# Patient Record
Sex: Female | Born: 1955 | Race: White | Hispanic: No | Marital: Married | Smoking: Never smoker
Health system: Southern US, Community
[De-identification: ages and names within clinical notes are randomized; demographics above are authoritative.]

## PROBLEM LIST (undated history)

## (undated) DIAGNOSIS — I10 Essential (primary) hypertension: Secondary | ICD-10-CM

---

## 1998-08-22 ENCOUNTER — Other Ambulatory Visit: Admission: RE | Admit: 1998-08-22 | Discharge: 1998-08-22 | Payer: Self-pay | Admitting: Obstetrics & Gynecology

## 1999-08-16 ENCOUNTER — Encounter: Payer: Self-pay | Admitting: Obstetrics and Gynecology

## 1999-08-16 ENCOUNTER — Encounter: Admission: RE | Admit: 1999-08-16 | Discharge: 1999-08-16 | Payer: Self-pay | Admitting: Obstetrics and Gynecology

## 1999-08-24 ENCOUNTER — Other Ambulatory Visit: Admission: RE | Admit: 1999-08-24 | Discharge: 1999-08-24 | Payer: Self-pay | Admitting: Obstetrics and Gynecology

## 2001-01-20 ENCOUNTER — Encounter: Payer: Self-pay | Admitting: Obstetrics and Gynecology

## 2001-01-20 ENCOUNTER — Encounter: Admission: RE | Admit: 2001-01-20 | Discharge: 2001-01-20 | Payer: Self-pay | Admitting: Obstetrics and Gynecology

## 2002-03-26 ENCOUNTER — Encounter: Admission: RE | Admit: 2002-03-26 | Discharge: 2002-03-26 | Payer: Self-pay | Admitting: Obstetrics and Gynecology

## 2002-03-26 ENCOUNTER — Encounter: Payer: Self-pay | Admitting: Obstetrics and Gynecology

## 2003-09-03 ENCOUNTER — Encounter: Admission: RE | Admit: 2003-09-03 | Discharge: 2003-09-03 | Payer: Self-pay | Admitting: Obstetrics and Gynecology

## 2004-11-01 ENCOUNTER — Encounter: Admission: RE | Admit: 2004-11-01 | Discharge: 2004-11-01 | Payer: Self-pay | Admitting: Obstetrics and Gynecology

## 2005-11-05 ENCOUNTER — Encounter: Admission: RE | Admit: 2005-11-05 | Discharge: 2005-11-05 | Payer: Self-pay | Admitting: Family Medicine

## 2006-11-07 ENCOUNTER — Encounter: Admission: RE | Admit: 2006-11-07 | Discharge: 2006-11-07 | Payer: Self-pay | Admitting: Family Medicine

## 2008-03-02 ENCOUNTER — Encounter: Admission: RE | Admit: 2008-03-02 | Discharge: 2008-03-02 | Payer: Self-pay | Admitting: Family Medicine

## 2009-03-28 ENCOUNTER — Encounter: Admission: RE | Admit: 2009-03-28 | Discharge: 2009-03-28 | Payer: Self-pay | Admitting: Family Medicine

## 2010-04-24 ENCOUNTER — Encounter: Admission: RE | Admit: 2010-04-24 | Discharge: 2010-04-24 | Payer: Self-pay | Admitting: Family Medicine

## 2011-05-15 ENCOUNTER — Other Ambulatory Visit: Payer: Self-pay | Admitting: Family Medicine

## 2011-05-15 DIAGNOSIS — Z1231 Encounter for screening mammogram for malignant neoplasm of breast: Secondary | ICD-10-CM

## 2011-05-29 ENCOUNTER — Ambulatory Visit
Admission: RE | Admit: 2011-05-29 | Discharge: 2011-05-29 | Disposition: A | Discharge status: Home / Self Care | Payer: 59 | Source: Ambulatory Visit | Attending: Family Medicine | Admitting: Family Medicine

## 2011-05-29 DIAGNOSIS — Z1231 Encounter for screening mammogram for malignant neoplasm of breast: Secondary | ICD-10-CM

## 2012-07-10 ENCOUNTER — Other Ambulatory Visit: Payer: Self-pay | Admitting: Family Medicine

## 2012-07-10 DIAGNOSIS — Z1231 Encounter for screening mammogram for malignant neoplasm of breast: Secondary | ICD-10-CM

## 2012-07-17 ENCOUNTER — Ambulatory Visit (INDEPENDENT_AMBULATORY_CARE_PROVIDER_SITE_OTHER): Payer: 59

## 2012-07-17 DIAGNOSIS — Z1231 Encounter for screening mammogram for malignant neoplasm of breast: Secondary | ICD-10-CM

## 2013-07-23 ENCOUNTER — Other Ambulatory Visit: Payer: Self-pay | Admitting: Family Medicine

## 2013-07-23 DIAGNOSIS — Z1231 Encounter for screening mammogram for malignant neoplasm of breast: Secondary | ICD-10-CM

## 2013-07-30 ENCOUNTER — Ambulatory Visit (INDEPENDENT_AMBULATORY_CARE_PROVIDER_SITE_OTHER): Payer: 59

## 2013-07-30 DIAGNOSIS — Z1231 Encounter for screening mammogram for malignant neoplasm of breast: Secondary | ICD-10-CM

## 2014-06-02 ENCOUNTER — Other Ambulatory Visit: Payer: Self-pay | Admitting: Family Medicine

## 2014-06-02 DIAGNOSIS — Z1231 Encounter for screening mammogram for malignant neoplasm of breast: Secondary | ICD-10-CM

## 2014-08-05 ENCOUNTER — Ambulatory Visit (INDEPENDENT_AMBULATORY_CARE_PROVIDER_SITE_OTHER): Payer: 59

## 2014-08-05 DIAGNOSIS — Z1231 Encounter for screening mammogram for malignant neoplasm of breast: Secondary | ICD-10-CM

## 2015-08-01 ENCOUNTER — Other Ambulatory Visit: Payer: Self-pay | Admitting: Family Medicine

## 2015-08-01 DIAGNOSIS — Z1231 Encounter for screening mammogram for malignant neoplasm of breast: Secondary | ICD-10-CM

## 2015-08-18 ENCOUNTER — Ambulatory Visit (INDEPENDENT_AMBULATORY_CARE_PROVIDER_SITE_OTHER): Payer: Commercial Managed Care - HMO

## 2015-08-18 DIAGNOSIS — Z1231 Encounter for screening mammogram for malignant neoplasm of breast: Secondary | ICD-10-CM | POA: Diagnosis not present

## 2016-08-07 ENCOUNTER — Other Ambulatory Visit: Payer: Self-pay | Admitting: Family Medicine

## 2016-08-07 DIAGNOSIS — Z1231 Encounter for screening mammogram for malignant neoplasm of breast: Secondary | ICD-10-CM

## 2016-08-21 ENCOUNTER — Ambulatory Visit (INDEPENDENT_AMBULATORY_CARE_PROVIDER_SITE_OTHER): Payer: 59

## 2016-08-21 DIAGNOSIS — Z1231 Encounter for screening mammogram for malignant neoplasm of breast: Secondary | ICD-10-CM | POA: Diagnosis not present

## 2017-02-12 ENCOUNTER — Encounter (HOSPITAL_COMMUNITY)
Admission: EM | Disposition: A | Discharge status: Home / Self Care | Payer: Self-pay | Source: Home / Self Care | Attending: Emergency Medicine

## 2017-02-12 ENCOUNTER — Encounter (HOSPITAL_COMMUNITY): Payer: Self-pay | Admitting: Emergency Medicine

## 2017-02-12 ENCOUNTER — Emergency Department (HOSPITAL_COMMUNITY): Payer: BLUE CROSS/BLUE SHIELD | Admitting: Anesthesiology

## 2017-02-12 ENCOUNTER — Observation Stay (HOSPITAL_COMMUNITY)
Admission: EM | Admit: 2017-02-12 | Discharge: 2017-02-13 | Disposition: A | Discharge status: Home / Self Care | Payer: BLUE CROSS/BLUE SHIELD | Attending: General Surgery | Admitting: General Surgery

## 2017-02-12 ENCOUNTER — Emergency Department (HOSPITAL_COMMUNITY): Payer: BLUE CROSS/BLUE SHIELD

## 2017-02-12 DIAGNOSIS — K219 Gastro-esophageal reflux disease without esophagitis: Secondary | ICD-10-CM | POA: Insufficient documentation

## 2017-02-12 DIAGNOSIS — Z881 Allergy status to other antibiotic agents status: Secondary | ICD-10-CM | POA: Diagnosis not present

## 2017-02-12 DIAGNOSIS — Z818 Family history of other mental and behavioral disorders: Secondary | ICD-10-CM | POA: Diagnosis not present

## 2017-02-12 DIAGNOSIS — E669 Obesity, unspecified: Secondary | ICD-10-CM | POA: Diagnosis not present

## 2017-02-12 DIAGNOSIS — Z6841 Body Mass Index (BMI) 40.0 and over, adult: Secondary | ICD-10-CM | POA: Insufficient documentation

## 2017-02-12 DIAGNOSIS — Z79899 Other long term (current) drug therapy: Secondary | ICD-10-CM | POA: Insufficient documentation

## 2017-02-12 DIAGNOSIS — Z809 Family history of malignant neoplasm, unspecified: Secondary | ICD-10-CM | POA: Diagnosis not present

## 2017-02-12 DIAGNOSIS — K8020 Calculus of gallbladder without cholecystitis without obstruction: Secondary | ICD-10-CM | POA: Diagnosis present

## 2017-02-12 DIAGNOSIS — Z7982 Long term (current) use of aspirin: Secondary | ICD-10-CM | POA: Insufficient documentation

## 2017-02-12 DIAGNOSIS — I10 Essential (primary) hypertension: Secondary | ICD-10-CM | POA: Insufficient documentation

## 2017-02-12 DIAGNOSIS — Z882 Allergy status to sulfonamides status: Secondary | ICD-10-CM | POA: Diagnosis not present

## 2017-02-12 DIAGNOSIS — Z833 Family history of diabetes mellitus: Secondary | ICD-10-CM | POA: Diagnosis not present

## 2017-02-12 DIAGNOSIS — K8000 Calculus of gallbladder with acute cholecystitis without obstruction: Principal | ICD-10-CM | POA: Diagnosis present

## 2017-02-12 HISTORY — DX: Essential (primary) hypertension: I10

## 2017-02-12 HISTORY — PX: CHOLECYSTECTOMY: SHX55

## 2017-02-12 LAB — URINALYSIS, ROUTINE W REFLEX MICROSCOPIC
Bacteria, UA: NONE SEEN
Bilirubin Urine: NEGATIVE
GLUCOSE, UA: NEGATIVE mg/dL
Hgb urine dipstick: NEGATIVE
KETONES UR: NEGATIVE mg/dL
Nitrite: NEGATIVE
PROTEIN: NEGATIVE mg/dL
Specific Gravity, Urine: 1.008 (ref 1.005–1.030)
pH: 7 (ref 5.0–8.0)

## 2017-02-12 LAB — COMPREHENSIVE METABOLIC PANEL
ALBUMIN: 4 g/dL (ref 3.5–5.0)
ALK PHOS: 81 U/L (ref 38–126)
ALT: 19 U/L (ref 14–54)
ANION GAP: 7 (ref 5–15)
AST: 22 U/L (ref 15–41)
BUN: 14 mg/dL (ref 6–20)
CHLORIDE: 103 mmol/L (ref 101–111)
CO2: 30 mmol/L (ref 22–32)
Calcium: 9.7 mg/dL (ref 8.9–10.3)
Creatinine, Ser: 0.89 mg/dL (ref 0.44–1.00)
GFR calc non Af Amer: >60 mL/min (ref >60)
Glucose, Bld: 137 mg/dL — ABNORMAL HIGH (ref 65–99)
Potassium: 3.6 mmol/L (ref 3.5–5.1)
Sodium: 140 mmol/L (ref 135–145)
Total Bilirubin: 0.4 mg/dL (ref 0.3–1.2)
Total Protein: 7.1 g/dL (ref 6.5–8.1)

## 2017-02-12 LAB — CBC
HCT: 42.5 % (ref 36.0–46.0)
HEMOGLOBIN: 14 g/dL (ref 12.0–15.0)
MCH: 28.1 pg (ref 26.0–34.0)
MCHC: 32.9 g/dL (ref 30.0–36.0)
MCV: 85.2 fL (ref 78.0–100.0)
Platelets: 243 10*3/uL (ref 150–400)
RBC: 4.99 MIL/uL (ref 3.87–5.11)
RDW: 13.8 % (ref 11.5–15.5)
WBC: 11.3 10*3/uL — ABNORMAL HIGH (ref 4.0–10.5)

## 2017-02-12 LAB — LIPASE, BLOOD: Lipase: 24 U/L (ref 11–51)

## 2017-02-12 SURGERY — LAPAROSCOPIC CHOLECYSTECTOMY WITH INTRAOPERATIVE CHOLANGIOGRAM
Anesthesia: General | Site: Abdomen

## 2017-02-12 MED ORDER — MULTIVITAMINS PO CAPS: 1.0000 | ORAL_CAPSULE | Freq: Every day | ORAL | Status: DC

## 2017-02-12 MED ORDER — PROPOFOL 10 MG/ML IV BOLUS
INTRAVENOUS | Status: DC | PRN
  Administered 2017-02-12: 150 mg via INTRAVENOUS

## 2017-02-12 MED ORDER — LISINOPRIL 20 MG PO TABS
20.0000 mg | ORAL_TABLET | Freq: Every day | ORAL | Status: DC
  Administered 2017-02-13: 20 mg via ORAL
  Filled 2017-02-12 (×2): qty 1

## 2017-02-12 MED ORDER — HYDROMORPHONE HCL 1 MG/ML IJ SOLN
INTRAMUSCULAR | Status: AC
  Filled 2017-02-12: qty 1

## 2017-02-12 MED ORDER — IOPAMIDOL (ISOVUE-300) INJECTION 61%
INTRAVENOUS | Status: AC
  Filled 2017-02-12: qty 50

## 2017-02-12 MED ORDER — 0.9 % SODIUM CHLORIDE (POUR BTL) OPTIME
TOPICAL | Status: DC | PRN
  Administered 2017-02-12: 1000 mL

## 2017-02-12 MED ORDER — MELATONIN 3 MG PO TABS: 3.0000 mg | ORAL_TABLET | Freq: Every day | ORAL | Status: DC

## 2017-02-12 MED ORDER — KETOROLAC TROMETHAMINE 30 MG/ML IJ SOLN: 30.0000 mg | Freq: Once | INTRAMUSCULAR | Status: DC | PRN

## 2017-02-12 MED ORDER — PROPOFOL 10 MG/ML IV BOLUS
INTRAVENOUS | Status: AC
  Filled 2017-02-12: qty 20

## 2017-02-12 MED ORDER — SODIUM CHLORIDE 0.9 % IV BOLUS (SEPSIS)
1000.0000 mL | Freq: Once | INTRAVENOUS | Status: AC
  Administered 2017-02-12: 1000 mL via INTRAVENOUS

## 2017-02-12 MED ORDER — FENTANYL CITRATE (PF) 100 MCG/2ML IJ SOLN
INTRAMUSCULAR | Status: DC | PRN
  Administered 2017-02-12: 50 ug via INTRAVENOUS
  Administered 2017-02-12: 100 ug via INTRAVENOUS
  Administered 2017-02-12 (×2): 50 ug via INTRAVENOUS

## 2017-02-12 MED ORDER — LACTATED RINGERS IR SOLN
Status: DC | PRN
  Administered 2017-02-12: 1000 mL

## 2017-02-12 MED ORDER — FENTANYL CITRATE (PF) 100 MCG/2ML IJ SOLN
25.0000 ug | Freq: Once | INTRAMUSCULAR | Status: AC
  Administered 2017-02-12: 25 ug via INTRAVENOUS
  Filled 2017-02-12: qty 2

## 2017-02-12 MED ORDER — ONDANSETRON 4 MG PO TBDP: 4.0000 mg | ORAL_TABLET | Freq: Four times a day (QID) | ORAL | Status: DC | PRN

## 2017-02-12 MED ORDER — ENOXAPARIN SODIUM 40 MG/0.4ML ~~LOC~~ SOLN
40.0000 mg | SUBCUTANEOUS | Status: DC
  Administered 2017-02-13: 40 mg via SUBCUTANEOUS
  Filled 2017-02-12: qty 0.4

## 2017-02-12 MED ORDER — ONDANSETRON HCL 4 MG/2ML IJ SOLN
INTRAMUSCULAR | Status: AC
  Filled 2017-02-12: qty 2

## 2017-02-12 MED ORDER — HYDRALAZINE HCL 20 MG/ML IJ SOLN: 10.0000 mg | INTRAMUSCULAR | Status: DC | PRN

## 2017-02-12 MED ORDER — ONDANSETRON HCL 4 MG/2ML IJ SOLN
INTRAMUSCULAR | Status: DC | PRN
  Administered 2017-02-12: 4 mg via INTRAVENOUS

## 2017-02-12 MED ORDER — LACTATED RINGERS IV SOLN
INTRAVENOUS | Status: DC | PRN
  Administered 2017-02-12 (×2): via INTRAVENOUS
  Administered 2017-02-12: 1000 mL via INTRAVENOUS

## 2017-02-12 MED ORDER — HYDROMORPHONE HCL 1 MG/ML IJ SOLN
0.2500 mg | INTRAMUSCULAR | Status: DC | PRN
  Administered 2017-02-12 (×2): 0.5 mg via INTRAVENOUS

## 2017-02-12 MED ORDER — CALCIUM CARBONATE ANTACID 500 MG PO CHEW: 500.0000 mg | CHEWABLE_TABLET | Freq: Two times a day (BID) | ORAL | Status: DC | PRN

## 2017-02-12 MED ORDER — FAMOTIDINE 20 MG PO TABS
40.0000 mg | ORAL_TABLET | Freq: Every day | ORAL | Status: DC
  Administered 2017-02-12: 22:00:00 40 mg via ORAL
  Filled 2017-02-12: qty 2

## 2017-02-12 MED ORDER — DEXAMETHASONE SODIUM PHOSPHATE 10 MG/ML IJ SOLN
INTRAMUSCULAR | Status: AC
  Filled 2017-02-12: qty 1

## 2017-02-12 MED ORDER — LORATADINE 10 MG PO TABS
10.0000 mg | ORAL_TABLET | Freq: Every day | ORAL | Status: DC
  Administered 2017-02-12: 22:00:00 10 mg via ORAL
  Filled 2017-02-12: qty 1

## 2017-02-12 MED ORDER — CEFTRIAXONE SODIUM 2 G IJ SOLR
2.0000 g | Freq: Once | INTRAMUSCULAR | Status: AC
  Administered 2017-02-12: 2 g via INTRAVENOUS
  Filled 2017-02-12: qty 2

## 2017-02-12 MED ORDER — BUPIVACAINE HCL (PF) 0.25 % IJ SOLN
INTRAMUSCULAR | Status: DC | PRN
  Administered 2017-02-12: 30 mL

## 2017-02-12 MED ORDER — MORPHINE SULFATE (PF) 4 MG/ML IV SOLN: 2.0000 mg | INTRAVENOUS | Status: DC | PRN

## 2017-02-12 MED ORDER — QUINAPRIL-HYDROCHLOROTHIAZIDE 20-25 MG PO TABS: 1.0000 | ORAL_TABLET | ORAL | Status: DC

## 2017-02-12 MED ORDER — SUGAMMADEX SODIUM 200 MG/2ML IV SOLN
INTRAVENOUS | Status: DC | PRN
  Administered 2017-02-12: 200 mg via INTRAVENOUS

## 2017-02-12 MED ORDER — SODIUM CHLORIDE 0.9 % IV BOLUS (SEPSIS)
500.0000 mL | Freq: Once | INTRAVENOUS | Status: AC
  Administered 2017-02-12: 500 mL via INTRAVENOUS

## 2017-02-12 MED ORDER — FENTANYL CITRATE (PF) 250 MCG/5ML IJ SOLN
INTRAMUSCULAR | Status: AC
  Filled 2017-02-12: qty 5

## 2017-02-12 MED ORDER — SODIUM CHLORIDE 0.9 % IV SOLN
INTRAVENOUS | Status: DC
  Administered 2017-02-12: 16:00:00 via INTRAVENOUS

## 2017-02-12 MED ORDER — ONDANSETRON HCL 4 MG/2ML IJ SOLN
4.0000 mg | Freq: Four times a day (QID) | INTRAMUSCULAR | Status: DC | PRN
  Administered 2017-02-12: 16:00:00 4 mg via INTRAVENOUS
  Filled 2017-02-12: qty 2

## 2017-02-12 MED ORDER — DEXAMETHASONE SODIUM PHOSPHATE 10 MG/ML IJ SOLN
INTRAMUSCULAR | Status: DC | PRN
  Administered 2017-02-12: 10 mg via INTRAVENOUS

## 2017-02-12 MED ORDER — SIMETHICONE 80 MG PO CHEW: 40.0000 mg | CHEWABLE_TABLET | Freq: Four times a day (QID) | ORAL | Status: DC | PRN

## 2017-02-12 MED ORDER — ONDANSETRON HCL 4 MG/2ML IJ SOLN
4.0000 mg | Freq: Once | INTRAMUSCULAR | Status: AC
  Administered 2017-02-12: 4 mg via INTRAVENOUS
  Filled 2017-02-12: qty 2

## 2017-02-12 MED ORDER — DIPHENHYDRAMINE HCL 25 MG PO CAPS: 25.0000 mg | ORAL_CAPSULE | Freq: Four times a day (QID) | ORAL | Status: DC | PRN

## 2017-02-12 MED ORDER — SUCCINYLCHOLINE CHLORIDE 200 MG/10ML IV SOSY
PREFILLED_SYRINGE | INTRAVENOUS | Status: DC | PRN
  Administered 2017-02-12: 100 mg via INTRAVENOUS

## 2017-02-12 MED ORDER — DIPHENHYDRAMINE HCL 50 MG/ML IJ SOLN: 25.0000 mg | Freq: Four times a day (QID) | INTRAMUSCULAR | Status: DC | PRN

## 2017-02-12 MED ORDER — PROMETHAZINE HCL 25 MG/ML IJ SOLN: 6.2500 mg | INTRAMUSCULAR | Status: DC | PRN

## 2017-02-12 MED ORDER — HYDROCHLOROTHIAZIDE 25 MG PO TABS
25.0000 mg | ORAL_TABLET | Freq: Every day | ORAL | Status: DC
  Administered 2017-02-13: 09:00:00 25 mg via ORAL
  Filled 2017-02-12 (×2): qty 1

## 2017-02-12 MED ORDER — ADULT MULTIVITAMIN W/MINERALS CH
1.0000 | ORAL_TABLET | Freq: Every day | ORAL | Status: DC
  Administered 2017-02-12: 22:00:00 1 via ORAL
  Filled 2017-02-12: qty 1

## 2017-02-12 MED ORDER — LIDOCAINE 2% (20 MG/ML) 5 ML SYRINGE
INTRAMUSCULAR | Status: DC | PRN
Start: 2017-02-12 — End: 2017-02-12
  Administered 2017-02-12: 100 mg via INTRAVENOUS

## 2017-02-12 MED ORDER — BUPIVACAINE HCL (PF) 0.25 % IJ SOLN
INTRAMUSCULAR | Status: AC
  Filled 2017-02-12: qty 30

## 2017-02-12 MED ORDER — IBUPROFEN 400 MG PO TABS: 600.0000 mg | ORAL_TABLET | Freq: Four times a day (QID) | ORAL | Status: DC | PRN

## 2017-02-12 MED ORDER — SUGAMMADEX SODIUM 200 MG/2ML IV SOLN
INTRAVENOUS | Status: AC
  Filled 2017-02-12: qty 2

## 2017-02-12 MED ORDER — ROCURONIUM BROMIDE 10 MG/ML (PF) SYRINGE
PREFILLED_SYRINGE | INTRAVENOUS | Status: DC | PRN
  Administered 2017-02-12: 10 mg via INTRAVENOUS
  Administered 2017-02-12: 40 mg via INTRAVENOUS

## 2017-02-12 MED ORDER — FENTANYL CITRATE (PF) 100 MCG/2ML IJ SOLN
50.0000 ug | Freq: Once | INTRAMUSCULAR | Status: AC
  Administered 2017-02-12: 50 ug via INTRAVENOUS
  Filled 2017-02-12: qty 2

## 2017-02-12 MED ORDER — HYDROCODONE-ACETAMINOPHEN 5-325 MG PO TABS
1.0000 | ORAL_TABLET | ORAL | Status: DC | PRN
  Administered 2017-02-12 – 2017-02-13 (×2): 1 via ORAL
  Filled 2017-02-12 (×2): qty 1

## 2017-02-12 SURGICAL SUPPLY — 44 items
APPLIER CLIP ROT 10 11.4 M/L (STAPLE)
BANDAGE ADH SHEER 1  50/CT (GAUZE/BANDAGES/DRESSINGS) ×3 IMPLANT
BENZOIN TINCTURE PRP APPL 2/3 (GAUZE/BANDAGES/DRESSINGS) ×3 IMPLANT
CABLE HIGH FREQUENCY MONO STRZ (ELECTRODE) ×3 IMPLANT
CATH CHOLANG 76X19 KUMAR (CATHETERS) ×3 IMPLANT
CHLORAPREP W/TINT 26ML (MISCELLANEOUS) ×3 IMPLANT
CLIP APPLIE ROT 10 11.4 M/L (STAPLE) IMPLANT
CLIP LIGATING HEM O LOK PURPLE (MISCELLANEOUS) IMPLANT
CLIP LIGATING HEMO LOK XL GOLD (MISCELLANEOUS) IMPLANT
CLOSURE WOUND 1/2 X4 (GAUZE/BANDAGES/DRESSINGS) ×1
COVER MAYO STAND STRL (DRAPES) ×3 IMPLANT
COVER SURGICAL LIGHT HANDLE (MISCELLANEOUS) ×3 IMPLANT
DECANTER SPIKE VIAL GLASS SM (MISCELLANEOUS) ×3 IMPLANT
DERMABOND ADVANCED (GAUZE/BANDAGES/DRESSINGS) ×2
DERMABOND ADVANCED .7 DNX12 (GAUZE/BANDAGES/DRESSINGS) ×1 IMPLANT
DRAIN CHANNEL 19F RND (DRAIN) IMPLANT
DRAPE C-ARM 42X120 X-RAY (DRAPES) ×3 IMPLANT
EVACUATOR SILICONE 100CC (DRAIN) IMPLANT
GLOVE BIOGEL PI IND STRL 7.0 (GLOVE) ×1 IMPLANT
GLOVE BIOGEL PI INDICATOR 7.0 (GLOVE) ×2
GLOVE SURG SS PI 7.0 STRL IVOR (GLOVE) ×3 IMPLANT
GOWN STRL REUS W/TWL LRG LVL3 (GOWN DISPOSABLE) ×3 IMPLANT
GOWN STRL REUS W/TWL XL LVL3 (GOWN DISPOSABLE) ×6 IMPLANT
GRASPER SUT TROCAR 14GX15 (MISCELLANEOUS) ×3 IMPLANT
IRRIG SUCT STRYKERFLOW 2 WTIP (MISCELLANEOUS) ×3
IRRIGATION SUCT STRKRFLW 2 WTP (MISCELLANEOUS) ×1 IMPLANT
KIT BASIN OR (CUSTOM PROCEDURE TRAY) ×3 IMPLANT
POUCH RETRIEVAL ECOSAC 10 (ENDOMECHANICALS) ×1 IMPLANT
POUCH RETRIEVAL ECOSAC 10MM (ENDOMECHANICALS) ×2
SCISSORS LAP 5X35 DISP (ENDOMECHANICALS) ×3 IMPLANT
SHEARS HARMONIC ACE PLUS 36CM (ENDOMECHANICALS) IMPLANT
SLEEVE XCEL OPT CAN 5 100 (ENDOMECHANICALS) ×6 IMPLANT
STOPCOCK 4 WAY LG BORE MALE ST (IV SETS) ×3 IMPLANT
STRIP CLOSURE SKIN 1/2X4 (GAUZE/BANDAGES/DRESSINGS) ×2 IMPLANT
SUT ETHILON 2 0 PS N (SUTURE) IMPLANT
SUT MNCRL AB 4-0 PS2 18 (SUTURE) ×3 IMPLANT
SUT VICRYL 0 ENDOLOOP (SUTURE) IMPLANT
TOWEL OR 17X26 10 PK STRL BLUE (TOWEL DISPOSABLE) ×3 IMPLANT
TOWEL OR NON WOVEN STRL DISP B (DISPOSABLE) IMPLANT
TRAY LAPAROSCOPIC (CUSTOM PROCEDURE TRAY) ×3 IMPLANT
TROCAR BLADELESS OPT 5 100 (ENDOMECHANICALS) ×3 IMPLANT
TROCAR OPTICAL STANDARD 5MM (TROCAR) ×3 IMPLANT
TROCAR XCEL 12X100 BLDLESS (ENDOMECHANICALS) ×3 IMPLANT
TUBING INSUF HEATED (TUBING) ×3 IMPLANT

## 2017-02-12 NOTE — Anesthesia Preprocedure Evaluation (Signed)
Anesthesia Evaluation  Patient identified by MRN, date of birth, ID band Patient awake    Reviewed: Allergy & Precautions, NPO status , Patient's Chart, lab work & pertinent test results  Airway Mallampati: II  TM Distance: >3 FB Neck ROM: Full    Dental no notable dental hx. (+)    Pulmonary neg pulmonary ROS,    Pulmonary exam normal breath sounds clear to auscultation       Cardiovascular hypertension, Normal cardiovascular exam Rhythm:Regular Rate:Normal     Neuro/Psych negative neurological ROS  negative psych ROS   GI/Hepatic negative GI ROS, Neg liver ROS,   Endo/Other  negative endocrine ROS  Renal/GU negative Renal ROS  negative genitourinary   Musculoskeletal negative musculoskeletal ROS (+)   Abdominal   Peds negative pediatric ROS (+)  Hematology negative hematology ROS (+)   Anesthesia Other Findings   Reproductive/Obstetrics negative OB ROS                            Anesthesia Physical Anesthesia Plan  ASA: II  Anesthesia Plan: General   Post-op Pain Management:    Induction: Intravenous  Airway Management Planned: Oral ETT  Additional Equipment:   Intra-op Plan:   Post-operative Plan: Extubation in OR  Informed Consent: I have reviewed the patients History and Physical, chart, labs and discussed the procedure including the risks, benefits and alternatives for the proposed anesthesia with the patient or authorized representative who has indicated his/her understanding and acceptance.   Dental advisory given  Plan Discussed with: CRNA  Anesthesia Plan Comments:         Anesthesia Quick Evaluation  

## 2017-02-12 NOTE — ED Triage Notes (Signed)
Pt states she has upper abd pain that started today after she ate supper, BBQ.  Pt states the pain radiates around her right side and into her back  Has had one episode of vomiting prior to arrival

## 2017-02-12 NOTE — H&P (Signed)
[facility] Consult/Admission Note  Jerome Otter [birth year]  [id number].    Requesting MD: Dr. Tomi Bamberger  Chief Complaint/Reason for Consult: abdominal pain    HPI:  Pt is a 61 year old obese female with a history of HTN and GERD who presented to the ED with complaints of abdominal pain for more than a month. Pt states she would have epigastric pain with fatty foods roughly 1.5-2hrs after eating. She began having worsening pain 2 days ago after eating ice cream for dinner. She had pain most of the night but it improved. Pain after dinner (BBQ and cole slaw) roughly 2 hours yesterday was in the epigastric region radiating into her back, severe, constant, nothing made it better, she tried tums without relief. She had a normal BM after the onset of pain. One episode of nonbloody emesis that she states contained her lunch and dinner. No other associated symptoms. She has not eaten since last night. She takes a ASA '81mg'$  a day. Scant NSAID use. Take famotidine for her GERD. She denies fever, chills, SOB, CP.  ROS:  Review of Systems  Constitutional: Negative for chills and fever.  Respiratory: Negative for shortness of breath.   Cardiovascular: Negative for chest pain.  Gastrointestinal: Positive for abdominal pain, nausea and vomiting. Negative for blood in stool, constipation and diarrhea.  Genitourinary: Negative for dysuria.  Skin: Negative for rash.  Neurological: Negative for dizziness, loss of consciousness and weakness.  All other systems reviewed and are negative.    Family History  Problem Relation Age of Onset  . Mental illness Mother   . Cancer Father   . Cancer Brother   . Diabetes Other     Past Medical History:  Diagnosis Date  . Hypertension     History reviewed. No pertinent surgical history.  Social History:  reports that she has never smoked. She has never used smokeless tobacco. She reports that she does not drink alcohol or use drugs.  Allergies:   Allergies  Allergen Reactions  . Macrodantin [Nitrofurantoin Macrocrystal] Rash  . Sulfa Antibiotics Rash     (Not in a hospital admission)  Blood pressure (!) 142/68, pulse 70, temperature 97.8 F (36.6 C), temperature source Oral, resp. rate 16, height 5' (1.524 m), weight 210 lb (95.3 kg), SpO2 96 %.  Physical Exam  Constitutional: She is oriented to person, place, and time and well-developed, well-nourished, and in no distress. Vital signs are normal. No distress.  Well appearing, obese white female, pleasant  HENT:  Head: Normocephalic and atraumatic.  Nose: Nose normal.  Mouth/Throat: Oropharynx is clear and moist. No oropharyngeal exudate.  Eyes: Conjunctivae and EOM are normal. Pupils are equal, round, and reactive to light. Right eye exhibits no discharge. Left eye exhibits no discharge. No scleral icterus.  Neck: Normal range of motion. Neck supple. No tracheal deviation present. No thyromegaly present.  Cardiovascular: Normal rate, regular rhythm, normal heart sounds and intact distal pulses.  Exam reveals no gallop and no friction rub.   No murmur heard. Pulses:      Radial pulses are 2+ on the right side, and 2+ on the left side.       Dorsalis pedis pulses are 2+ on the right side, and 2+ on the left side.  Pulmonary/Chest: Effort normal and breath sounds normal. No respiratory distress. She has no decreased breath sounds. She has no wheezes. She has no rhonchi. She has no rales.  Abdominal: Soft. Normal appearance and bowel sounds are normal. She  exhibits no distension and no mass. There is no hepatosplenomegaly. There is tenderness in the right upper quadrant and epigastric area. There is no rebound, no guarding, no tenderness at McBurney's point and negative Murphy's sign.  Musculoskeletal: Normal range of motion. She exhibits no edema or deformity.  Lymphadenopathy:    She has no cervical adenopathy.  Neurological: She is alert and oriented to person, place, and  time.  Skin: Skin is warm and dry. No rash noted. She is not diaphoretic.  Psychiatric: Mood and affect normal.  Nursing note and vitals reviewed.   Results for orders placed or performed during the hospital encounter of 02/12/17 (from the past 48 hour(s))  Lipase, blood     Status: None   Collection Time: 02/12/17 12:50 AM  Result Value Ref Range   Lipase 24 11 - 51 U/L  Comprehensive metabolic panel     Status: Abnormal   Collection Time: 02/12/17 12:50 AM  Result Value Ref Range   Sodium 140 135 - 145 mmol/L   Potassium 3.6 3.5 - 5.1 mmol/L   Chloride 103 101 - 111 mmol/L   CO2 30 22 - 32 mmol/L   Glucose, Bld 137 (H) 65 - 99 mg/dL   BUN 14 6 - 20 mg/dL   Creatinine, Ser 0.89 0.44 - 1.00 mg/dL   Calcium 9.7 8.9 - 10.3 mg/dL   Total Protein 7.1 6.5 - 8.1 g/dL   Albumin 4.0 3.5 - 5.0 g/dL   AST 22 15 - 41 U/L   ALT 19 14 - 54 U/L   Alkaline Phosphatase 81 38 - 126 U/L   Total Bilirubin 0.4 0.3 - 1.2 mg/dL   GFR calc non Af Amer >60 >60 mL/min   GFR calc Af Amer >60 >60 mL/min    Comment: (NOTE) The eGFR has been calculated using the CKD EPI equation. This calculation has not been validated in all clinical situations. eGFR's persistently <60 mL/min signify possible Chronic Kidney Disease.    Anion gap 7 5 - 15  CBC     Status: Abnormal   Collection Time: 02/12/17 12:50 AM  Result Value Ref Range   WBC 11.3 (H) 4.0 - 10.5 K/uL   RBC 4.99 3.87 - 5.11 MIL/uL   Hemoglobin 14.0 12.0 - 15.0 g/dL   HCT 42.5 36.0 - 46.0 %   MCV 85.2 78.0 - 100.0 fL   MCH 28.1 26.0 - 34.0 pg   MCHC 32.9 30.0 - 36.0 g/dL   RDW 13.8 11.5 - 15.5 %   Platelets 243 150 - 400 K/uL  Urinalysis, Routine w reflex microscopic     Status: Abnormal   Collection Time: 02/12/17  4:06 AM  Result Value Ref Range   Color, Urine YELLOW YELLOW   APPearance CLEAR CLEAR   Specific Gravity, Urine 1.008 1.005 - 1.030   pH 7.0 5.0 - 8.0   Glucose, UA NEGATIVE NEGATIVE mg/dL   Hgb urine dipstick NEGATIVE  NEGATIVE   Bilirubin Urine NEGATIVE NEGATIVE   Ketones, ur NEGATIVE NEGATIVE mg/dL   Protein, ur NEGATIVE NEGATIVE mg/dL   Nitrite NEGATIVE NEGATIVE   Leukocytes, UA LARGE (A) NEGATIVE   RBC / HPF 0-5 0 - 5 RBC/hpf   WBC, UA TOO NUMEROUS TO COUNT 0 - 5 WBC/hpf   Bacteria, UA NONE SEEN NONE SEEN   Squamous Epithelial / LPF 0-5 (A) NONE SEEN   Mucous PRESENT    US Abdomen Limited Ruq  Result Date: 02/12/2017 CLINICAL DATA:  Food intolerance for  several months, increased last night. Acute right upper quadrant pain. Vomiting. Hypertension. EXAM: US ABDOMEN LIMITED - RIGHT UPPER QUADRANT COMPARISON:  None. FINDINGS: Gallbladder: Mobile stones are seen in the gallbladder, largest measuring 1.6 cm diameter. No gallbladder wall thickening, edema, or sludge. Murphy's sign is negative. Common bile duct: Diameter: 4.2 mm, normal Liver: No focal lesion identified. Within normal limits in parenchymal echogenicity. IMPRESSION: Cholelithiasis without additional evidence of cholecystitis. Electronically Signed   By: Lucienne Capers M.D.   On: 02/12/2017 06:44      Assessment/Plan Symptomatic Cholelithiasis  - no concerns for cholecystitis on imaging - WBC 11.3 - pt would like [facility], likely today or tomorrow - will admit to [facility] service  Will discuss timing with MD. Thank you for the consult.   Kalman Drape, [facility] 02/12/2017, 9:25 AM Pager: [phone] Consults: [phone] Mon-Fri 7:00 am-4:30 pm Sat-Sun 7:00 am-11:30 am

## 2017-02-12 NOTE — ED Notes (Signed)
SURGERY PRESENT. PT WILL STAY IN ED AND TRANSFER TO SURGERY. EXPECTED SURGERY TIME 12 NOON.  CONSENT SIGNED. CHARGE STACEY W RN, PT AND FAMILY MADE AWARE OF PLAN OF CARE.

## 2017-02-12 NOTE — ED Notes (Signed)
ED Provider at bedside. 

## 2017-02-12 NOTE — Transfer of Care (Signed)
Immediate Anesthesia Transfer of Care Note  Patient: Jennifer Reese  Procedure(s) Performed: Procedure(s): LAPAROSCOPIC CHOLECYSTECTOMY (N/A)  Patient Location: PACU  Anesthesia Type:General  Level of Consciousness:  sedated, patient cooperative and responds to stimulation  Airway & Oxygen Therapy:Patient Spontanous Breathing and Patient connected to face mask oxgen  Post-op Assessment:  Report given to PACU RN and Post -op Vital signs reviewed and stable  Post vital signs:  Reviewed and stable  Last Vitals:  Vitals:   02/12/17 1124 02/12/17 1332  BP: 121/67 (!) 148/94  Pulse: 74 89  Resp: 18 17  Temp: 36.6 C (P) 36.9 C    Complications: No apparent anesthesia complications

## 2017-02-12 NOTE — Op Note (Signed)
PATIENT:  Jennifer Reese  61 y.o. female  PRE-OPERATIVE DIAGNOSIS:  Cholecystitis  POST-OPERATIVE DIAGNOSIS:  Cholecystitis  PROCEDURE:  Procedure(s): LAPAROSCOPIC CHOLECYSTECTOMY   SURGEON:  Surgeon(s): Omarr Hann, De BlanchLuke Aaron, MD  ASSISTANT: Mattie MarlinJessica Focht, P.A.  ANESTHESIA:   local and general  Indications for procedure: Jennifer Reese is a 61 y.o. female with symptoms of Abdominal pain and Nausea and vomiting consistent with gallbladder disease, Confirmed by Ultrasound.  Description of procedure: The patient was brought into the operative suite, placed supine. Anesthesia was administered with endotracheal tube. Patient was strapped in place and foot board was secured. All pressure points were offloaded by foam padding. The patient was prepped and draped in the usual sterile fashion.  A small incision was made to the right of the umbilicus. A 5mm trocar was inserted into the peritoneal cavity with optical entry. Pneumoperitoneum was applied with high flow low pressure. 2 5mm trocars were placed in the RUQ. A 12mm trocar was placed in the subxiphoid space. All trocars sites were first anesthesized with 0.25% marcaine with epinephrine in the subcutaneous and preperitoneal layers. Next the patient was placed in reverse trendelenberg. The gallbladder was red and inflamed.  The gallbladder was retracted cephalad and lateral. The peritoneum was reflected off the infundibulum working lateral to medial. The cystic duct and cystic artery were identified and further dissection revealed a critical view. The cystic duct and cystic artery were doubly clipped and ligated.   The gallbladder was removed off the liver bed with cautery. The Gallbladder was placed in a specimen bag. The gallbladder fossa was irrigated and hemostasis was applied with cautery. The gallbladder was removed via the 12mm trocar. The fascial defect was closed with interrupted 0 vicryl suture via laparoscopic trans-fascial suture passer.  Pneumoperitoneum was removed, all trocar were removed. All incisions were closed with 4-0 monocryl subcuticular stitch. The patient woke from anesthesia and was brought to PACU in stable condition. All counts were correct  Findings: inflamed gallbladder  Specimen: gallbladder  Blood loss: Total I/O In: 1150 [I.V.:1100; IV Piggyback:50] Out: -  ml  Local anesthesia: 30ml 0.25% marcaine  Complications: none  PLAN OF CARE: Admit for overnight observation  PATIENT DISPOSITION:  PACU - hemodynamically stable.  Feliciana RossettiLuke Almarie Kurdziel, M.D. General, Bariatric, & Minimally Invasive Surgery [facility]

## 2017-02-12 NOTE — Anesthesia Procedure Notes (Signed)
Procedure Name: Intubation Date/Time: 02/12/2017 12:35 PM Performed by: Tula Schryver, Virgel Gess Pre-anesthesia Checklist: Patient identified, Emergency Drugs available, Suction available, Patient being monitored and Timeout performed Patient Re-evaluated:Patient Re-evaluated prior to inductionOxygen Delivery Method: Circle system utilized Preoxygenation: Pre-oxygenation with 100% oxygen Intubation Type: IV induction Ventilation: Mask ventilation without difficulty Laryngoscope Size: Mac and 4 Grade View: Grade III Tube type: Oral Tube size: 7.5 mm Number of attempts: 1 Airway Equipment and Method: Bougie stylet Placement Confirmation: ETT inserted through vocal cords under direct vision,  positive ETCO2,  CO2 detector and breath sounds checked- equal and bilateral Secured at: 21 cm Tube secured with: Tape Dental Injury: Teeth and Oropharynx as per pre-operative assessment

## 2017-02-12 NOTE — ED Notes (Signed)
Ultrasound at bedside

## 2017-02-12 NOTE — ED Provider Notes (Signed)
[facility]-EMERGENCY DEPT Provider Note   CSN: [id number] Arrival date & time: 02/12/17  0005  Time seen 05:21 AM   History   Chief Complaint Chief Complaint  Patient presents with  . Abdominal Pain    HPI Jennifer Reese is a 61 y.o. female.  HPI   patient states for the past few months she has learned to avoid eating fried foods because it will cause nausea. She states a week ago she ate lettuce and spaghetti and had nausea all day. She states on May 6 she ate butter pecan Ice Cream and Had Discomfort All Night Long. She States She Had Burning in Her Abdomen and She Took Tums and the Pain Got Better.Tonight she ate BBQ and slaw and in less than an hour she started having abdominal bloating and upper abdominal pain that radiates into her back. She also states she has some soreness in her right lower quadrant. She states she had nausea and vomited once around 11 PM. She denies diarrhea and states she had a normal bowel movement. She denies any fever. She states the Tums did not make her pain go away tonight. She states she still having significant pain. She states her sister and one of her sons has had their gallbladder removed.  PCP Richmond Campbell., PA-C   Past Medical History:  Diagnosis Date  . Hypertension     There are no active problems to display for this patient.   History reviewed. No pertinent surgical history.  OB History    No data available       Home Medications    Prior to Admission medications   Medication Sig Start Date End Date Taking? Authorizing Provider  aspirin EC 81 MG tablet Take 81 mg by mouth every morning.   Yes [provider]  calcium carbonate (TUMS) 500 MG chewable tablet Chew 500 mg by mouth 2 (two) times daily as needed for indigestion or heartburn.   Yes [provider]  famotidine (PEPCID) 40 MG tablet Take 40 mg by mouth at bedtime. 01/10/17  Yes [provider]  loratadine (CLARITIN) 10 MG tablet Take 10 mg by mouth  at bedtime.   Yes [provider]  Melatonin 3 MG TABS Take 3 mg by mouth at bedtime.   Yes [provider]  Multiple Vitamin (MULTIVITAMIN) capsule Take 1 capsule by mouth at bedtime.   Yes [provider]  quinapril-hydrochlorothiazide (ACCURETIC) 20-25 MG tablet Take 1 tablet by mouth every morning. 10/23/16  Yes [provider]  vitamin E 400 UNIT capsule Take 400 Units by mouth at bedtime.   Yes [provider]    Family History Family History  Problem Relation Age of Onset  . Mental illness Mother   . Cancer Father   . Cancer Brother   . Diabetes Other     Social History Social History  Substance Use Topics  . Smoking status: Never Smoker  . Smokeless tobacco: Never Used  . Alcohol use No  lives at home Lives with spouse   Allergies   Macrodantin [nitrofurantoin macrocrystal] and Sulfa antibiotics   Review of Systems Review of Systems  All other systems reviewed and are negative.    Physical Exam Updated Vital Signs BP 136/76 (BP Location: Left Arm)   Pulse 77   Temp 97.8 F (36.6 C) (Oral)   Resp 19   Ht 5' (1.524 m)   Wt 210 lb (95.3 kg)   SpO2 98%   BMI 41.01 kg/m  Vital signs normal    Physical Exam  Constitutional: She is oriented to person, place, and time. She appears well-developed and well-nourished.  Non-toxic appearance. She does not appear ill. No distress.  HENT:  Head: Normocephalic and atraumatic.  Right Ear: External ear normal.  Left Ear: External ear normal.  Nose: Nose normal. No mucosal edema or rhinorrhea.  Mouth/Throat: Oropharynx is clear and moist and mucous membranes are normal. No dental abscesses or uvula swelling.  Eyes: Conjunctivae and EOM are normal. Pupils are equal, round, and reactive to light.  Neck: Normal range of motion and full passive range of motion without pain. Neck supple.  Cardiovascular: Normal rate, regular rhythm and normal heart sounds.  Exam reveals no  gallop and no friction rub.   No murmur heard. Pulmonary/Chest: Effort normal and breath sounds normal. No respiratory distress. She has no wheezes. She has no rhonchi. She has no rales. She exhibits no tenderness and no crepitus.  Abdominal: Soft. Normal appearance and bowel sounds are normal. She exhibits no distension. There is no tenderness. There is no rebound and no guarding.    Patient is tender across her upper abdomen but is most tender in the right upper quadrant. She also has some tenderness in the right lower quadrant. She does not have pain in her abdomen on knee tapping were have a positive psoas sign. She states changing positions or movement does not make that pain worse.  Musculoskeletal: Normal range of motion. She exhibits no edema or tenderness.  Moves all extremities well.   Neurological: She is alert and oriented to person, place, and time. She has normal strength. No cranial nerve deficit.  Skin: Skin is warm, dry and intact. No rash noted. No erythema. No pallor.  Psychiatric: She has a normal mood and affect. Her speech is normal and behavior is normal. Her mood appears not anxious.  Nursing note and vitals reviewed.    ED Treatments / Results  Labs (all labs ordered are listed, but only abnormal results are displayed) Results for orders placed or performed during the hospital encounter of 02/12/17  Lipase, blood  Result Value Ref Range   Lipase 24 11 - 51 U/L  Comprehensive metabolic panel  Result Value Ref Range   Sodium 140 135 - 145 mmol/L   Potassium 3.6 3.5 - 5.1 mmol/L   Chloride 103 101 - 111 mmol/L   CO2 30 22 - 32 mmol/L   Glucose, Bld 137 (H) 65 - 99 mg/dL   BUN 14 6 - 20 mg/dL   Creatinine, Ser 4.09 0.44 - 1.00 mg/dL   Calcium 9.7 8.9 - 81.1 mg/dL   Total Protein 7.1 6.5 - 8.1 g/dL   Albumin 4.0 3.5 - 5.0 g/dL   AST 22 15 - 41 U/L   ALT 19 14 - 54 U/L   Alkaline Phosphatase 81 38 - 126 U/L   Total Bilirubin 0.4 0.3 - 1.2 mg/dL   GFR calc non  Af Amer >60 >60 mL/min   GFR calc Af Amer >60 >60 mL/min   Anion gap 7 5 - 15  CBC  Result Value Ref Range   WBC 11.3 (H) 4.0 - 10.5 K/uL   RBC 4.99 3.87 - 5.11 MIL/uL   Hemoglobin 14.0 12.0 - 15.0 g/dL   HCT 91.4 78.2 - 95.6 %   MCV 85.2 78.0 - 100.0 fL   MCH 28.1 26.0 - 34.0 pg   MCHC 32.9 30.0 - 36.0 g/dL   RDW 21.3 08.6 -  15.5 %   Platelets 243 150 - 400 K/uL  Urinalysis, Routine w reflex microscopic  Result Value Ref Range   Color, Urine YELLOW YELLOW   APPearance CLEAR CLEAR   Specific Gravity, Urine 1.008 1.005 - 1.030   pH 7.0 5.0 - 8.0   Glucose, UA NEGATIVE NEGATIVE mg/dL   Hgb urine dipstick NEGATIVE NEGATIVE   Bilirubin Urine NEGATIVE NEGATIVE   Ketones, ur NEGATIVE NEGATIVE mg/dL   Protein, ur NEGATIVE NEGATIVE mg/dL   Nitrite NEGATIVE NEGATIVE   Leukocytes, UA LARGE (A) NEGATIVE   RBC / HPF 0-5 0 - 5 RBC/hpf   WBC, UA TOO NUMEROUS TO COUNT 0 - 5 WBC/hpf   Bacteria, UA NONE SEEN NONE SEEN   Squamous Epithelial / LPF 0-5 (A) NONE SEEN   Mucous PRESENT    Laboratory interpretation all normal except Leukocytosis, significant white blood cells in her urine without positive nitrates or bacteria. Patient states that was similar to a recent UA at her doctor's office.  EKG  EKG Interpretation None       Radiology [place] Abdomen Limited Ruq  Result Date: 02/12/2017 CLINICAL DATA:  Food intolerance for several months, increased last night. Acute right upper quadrant pain. Vomiting. Hypertension. EXAM: US ABDOMEN LIMITED - RIGHT UPPER QUADRANT COMPARISON:  None. FINDINGS: Gallbladder: Mobile stones are seen in the gallbladder, largest measuring 1.6 cm diameter. No gallbladder wall thickening, edema, or sludge. Murphy's sign is negative. Common bile duct: Diameter: 4.2 mm, normal Liver: No focal lesion identified. Within normal limits in parenchymal echogenicity. IMPRESSION: Cholelithiasis without additional evidence of cholecystitis. Electronically Signed   By: Burman NievesWilliam   Stevens M.D.   On: 02/12/2017 06:44    Procedures Procedures (including critical care time)  Medications Ordered in ED Medications  cefTRIAXone (ROCEPHIN) 2 g in dextrose 5 % 50 mL IVPB (not administered)  fentaNYL (SUBLIMAZE) injection 25 mcg (not administered)  sodium chloride 0.9 % bolus 1,000 mL (1,000 mLs Intravenous New Bag/Given 02/12/17 0538)  sodium chloride 0.9 % bolus 500 mL (0 mLs Intravenous Stopped 02/12/17 0711)  ondansetron (ZOFRAN) injection 4 mg (4 mg Intravenous Given 02/12/17 0538)  fentaNYL (SUBLIMAZE) injection 50 mcg (50 mcg Intravenous Given 02/12/17 0539)     Initial Impression / Assessment and Plan / ED Course  I have reviewed the triage vital signs and the nursing notes.  Pertinent labs & imaging results that were available during my care of the patient were reviewed by me and considered in my medical decision making (see chart for details).  Patient was given IV fluids and IV pain and nausea medication. We are going to start with ultrasound of her right upper quadrant to look for gallstones.  7:35 AM patient states her pain is improved but not gone. We discussed her ultrasound result. Patient was given the option of going home with medication or having surgery see her today for possible cholecystectomy. She prefers to be evaluated today. Patient was started on antibiotics. She was given more pain medication.  8:21 AM I spoke to the surgery PA, Shanda BumpsJessica, who will come see patient.   Final Clinical Impressions(s) / ED Diagnoses   Final diagnoses:  Calculus of gallbladder without cholecystitis without obstruction    Disposition pending surgery evaluation  Devoria AlbeIva Salil Raineri, MD, Concha PyoFACEP     Aireona Torelli, MD 02/12/17 [phone]

## 2017-02-12 NOTE — Progress Notes (Signed)
PHARMACIST - PHYSICIAN ORDER COMMUNICATION  CONCERNING: P&T Medication Policy on Herbal Medications  DESCRIPTION:  This patient's order for:  Melatonin  has been noted.  This product(s) is classified as an "herbal" or natural product. Due to a lack of definitive safety studies or FDA approval, nonstandard manufacturing practices, plus the potential risk of unknown drug-drug interactions while on inpatient medications, the Pharmacy and Therapeutics Committee does not permit the use of "herbal" or natural products of this type within [facility].   ACTION TAKEN: The pharmacy department is unable to verify this order at this time and your patient has been informed of this safety policy. Please reevaluate patient's clinical condition at discharge and address if the herbal or natural product(s) should be resumed at that time.  Loralee PacasErin Rasheda Ledger, PharmD, BCPS 02/12/2017 2:59 PM Pharmacy [phone]

## 2017-02-12 NOTE — Anesthesia Postprocedure Evaluation (Signed)
Anesthesia Post Note  Patient: Jennifer Reese  Procedure(s) Performed: Procedure(s) (LRB): LAPAROSCOPIC CHOLECYSTECTOMY (N/A)  Patient location during evaluation: PACU Anesthesia Type: General Level of consciousness: awake and alert Pain management: pain level controlled Vital Signs Assessment: post-procedure vital signs reviewed and stable Respiratory status: spontaneous breathing, nonlabored ventilation, respiratory function stable and patient connected to nasal cannula oxygen Cardiovascular status: blood pressure returned to baseline and stable Postop Assessment: no signs of nausea or vomiting Anesthetic complications: no       Last Vitals:  Vitals:   02/12/17 1415 02/12/17 1433  BP: (!) 141/73 (!) 156/70  Pulse: 72 79  Resp: 19 16  Temp:  36.5 C    Last Pain:  Vitals:   02/12/17 1415  TempSrc:   PainSc: 2                  [name]

## 2017-02-13 ENCOUNTER — Encounter (HOSPITAL_COMMUNITY): Payer: Self-pay | Admitting: General Surgery

## 2017-02-13 MED ORDER — HYDROCODONE-ACETAMINOPHEN 5-325 MG PO TABS: 1.0000 | ORAL_TABLET | ORAL | 0 refills | Status: AC | PRN

## 2017-02-13 NOTE — Discharge Instructions (Signed)
Please arrive at least 30 min before your appointment to complete your check in paperwork.  If you are unable to arrive 30 min prior to your appointment time we may have to cancel or reschedule you.  LAPAROSCOPIC SURGERY: POST OP INSTRUCTIONS  1. DIET: Follow a light bland diet the first 24 hours after arrival home, such as soup, liquids, crackers, etc. Be sure to include lots of fluids daily. Avoid fast food or heavy meals as your are more likely to get nauseated. Eat a low fat the next few days after surgery.  2. Take your usually prescribed home medications unless otherwise directed. 3. PAIN CONTROL:  1. Pain is best controlled by a usual combination of three different methods TOGETHER:  1. Ice/Heat 2. Over the counter pain medication 3. Prescription pain medication 2. Most patients will experience some swelling and bruising around the incisions. Ice packs or heating pads (30-60 minutes up to 6 times a day) will help. Use ice for the first few days to help decrease swelling and bruising, then switch to heat to help relax tight/sore spots and speed recovery. Some people prefer to use ice alone, heat alone, alternating between ice & heat. Experiment to what works for you. Swelling and bruising can take several weeks to resolve.  3. It is helpful to take an over-the-counter pain medication regularly for the first few weeks. Choose one of the following that works best for you:  1. Naproxen (Aleve, etc) Two 220mg  tabs twice a day 2. Ibuprofen (Advil, etc) Three 200mg  tabs four times a day (every meal & bedtime) 3. Acetaminophen (Tylenol, etc) 500-650mg  four times a day (every meal & bedtime) 4. A prescription for pain medication (such as oxycodone, hydrocodone, etc) should be given to you upon discharge. Take your pain medication as prescribed.  1. If you are having problems/concerns with the prescription medicine (does not control pain, nausea, vomiting, rash, itching, etc), please call us [phone]  [phone] to see if we need to switch you to a different pain medicine that will work better for you and/or control your side effect better. 2. If you need a refill on your pain medication, please contact your pharmacy. They will contact our office to request authorization. Prescriptions will not be filled after 5 pm or on week-ends. 4. Avoid getting constipated. Between the surgery and the pain medications, it is common to experience some constipation. Increasing fluid intake and taking a fiber supplement (such as Metamucil, Citrucel, FiberCon, MiraLax, etc) 1-2 times a day regularly will usually help prevent this problem from occurring. A mild laxative (prune juice, Milk of Magnesia, MiraLax, etc) should be taken according to package directions if there are no bowel movements after 48 hours.  5. Watch out for diarrhea. If you have many loose bowel movements, simplify your diet to bland foods & liquids for a few days. Stop any stool softeners and decrease your fiber supplement. Switching to mild anti-diarrheal medications (Kayopectate, Pepto Bismol) can help. If this worsens or does not improve, please call us. 4. Wash / shower every day. You may shower tomorrow (2 days after surgery) and remove the Band-Aids. Leave the steri-strips.. Continue to shower over incision(s) after the dressing is off. You do not need to re-cover your incisions. 6. ACTIVITIES as tolerated:  1. You may resume regular (light) daily activities beginning the next day--such as daily self-care, walking, climbing stairs--gradually increasing activities as tolerated. If you can walk 30 minutes without difficulty, it is safe to try more intense  activity such as jogging, treadmill, bicycling, low-impact aerobics, swimming, etc. 2. Save the most intensive and strenuous activity for last such as sit-ups, heavy lifting, contact sports, etc Refrain from any heavy lifting or straining until you are off narcotics for pain control.  3. DO NOT PUSH  THROUGH PAIN. Let pain be your guide: If it hurts to do something, don't do it. Pain is your body warning you to avoid that activity for another week until the pain goes down. 4. You may drive when you are no longer taking prescription pain medication, you can comfortably wear a seatbelt, and you can safely maneuver your car and apply brakes. 5. You may have sexual intercourse when it is comfortable.  7. FOLLOW UP in our office  1. Please call [facility] at [phone] to set up an appointment to see your surgeon in the office for a follow-up appointment approximately 2-3 weeks after your surgery. 2. Make sure that you call for this appointment the day you arrive home to insure a convenient appointment time.      10. IF YOU HAVE DISABILITY OR FAMILY LEAVE FORMS, BRING THEM TO THE               OFFICE FOR PROCESSING.   WHEN TO CALL us [phone]:  1. Poor pain control 2. Reactions / problems with new medications (rash/itching, nausea, etc)  3. Fever over 101.5 F (38.5 C) 4. Inability to urinate 5. Nausea and/or vomiting 6. Worsening swelling or bruising 7. Continued bleeding from incision. 8. Increased pain, redness, or drainage from the incision  The clinic staff is available to answer your questions during regular business hours (8:30am-5pm). Please dont hesitate to call and ask to speak to one of our nurses for clinical concerns.  If you have a medical emergency, go to the nearest emergency room or call 911.  A surgeon from [facility] is always on call at the [facility] [place]  [address], [place], [place] [address] ?  MAIN: [phone] ? TOLL FREE: [phone] ?  FAX [phone]  [email or web address]

## 2017-02-13 NOTE — Discharge Summary (Signed)
[facility] Discharge Summary   Patient ID: Jennifer HomeBonnie Knauer MRN: [id number] DOB/AGE: 62-Jan-1957 62 y.o.  Admit date: 02/12/2017 Discharge date: 02/13/2017  Admitting Diagnosis: cholecystitis  Discharge Diagnosis Patient Active Problem List   Diagnosis Date Noted  . Acute calculous cholecystitis 02/12/2017    Consultants None  Imaging: Kore[place] Abdomen Limited Ruq  Result Date: 02/12/2017 CLINICAL DATA:  Food intolerance for several months, increased last night. Acute right upper quadrant pain. Vomiting. Hypertension. EXAM: US ABDOMEN LIMITED - RIGHT UPPER QUADRANT COMPARISON:  None. FINDINGS: Gallbladder: Mobile stones are seen in the gallbladder, largest measuring 1.6 cm diameter. No gallbladder wall thickening, edema, or sludge. Murphy's sign is negative. Common bile duct: Diameter: 4.2 mm, normal Liver: No focal lesion identified. Within normal limits in parenchymal echogenicity. IMPRESSION: Cholelithiasis without additional evidence of cholecystitis. Electronically Signed   By: Burman NievesWilliam  Stevens M.D.   On: 02/12/2017 06:44    Procedures Dr. Sheliah HatchKinsinger (02/12/17) - Laparoscopic Cholecystectomy    Hospital Course:  Jennifer Reese is a 61 year old female who [facility].  Workup showed cholecystitis.  Patient was admitted and underwent procedure listed above.  Tolerated procedure well and was transferred to the floor.  Diet was advanced as tolerated.  On POD#1, the patient was voiding well, tolerating diet, ambulating well, pain well controlled, vital signs stable, incisions c/d/i and felt stable for discharge home.  Patient will follow up in our office in 2 weeks and knows to call with questions or concerns.  She will call to confirm appointment date/time.    Patient was discharged in good condition.  The [place] Substance controlled database was reviewed prior to prescribing narcotic pain medication to this patient.  Physical Exam: General:   Alert, NAD, pleasant, cooperative, well appearing Cardio: RRR, S1 & S2 normal, no murmur, rubs, gallops Resp: Effort normal, lungs CTA bilaterally, no wheezes, rales, rhonchi Abd:  Soft, ND, + BS, incisions C/D/I, mild generalized TTP, no guarding Skin: warm and dry, no rashes noted   Allergies as of 02/13/2017      Reactions   Macrodantin [nitrofurantoin Macrocrystal] Rash   Sulfa Antibiotics Rash      Medication List    TAKE these medications   aspirin EC 81 MG tablet Take 81 mg by mouth every morning.   famotidine 40 MG tablet Commonly known as:  PEPCID Take 40 mg by mouth at bedtime.   HYDROcodone-acetaminophen 5-325 MG tablet Commonly known as:  NORCO/VICODIN Take 1-2 tablets by mouth every 4 (four) hours as needed for moderate pain.   loratadine 10 MG tablet Commonly known as:  CLARITIN Take 10 mg by mouth at bedtime.   Melatonin 3 MG Tabs Take 3 mg by mouth at bedtime.   multivitamin capsule Take 1 capsule by mouth at bedtime.   quinapril-hydrochlorothiazide 20-25 MG tablet Commonly known as:  ACCURETIC Take 1 tablet by mouth every morning.   TUMS 500 MG chewable tablet Generic drug:  calcium carbonate Chew 500 mg by mouth 2 (two) times daily as needed for indigestion or heartburn.   vitamin E 400 UNIT capsule Take 400 Units by mouth at bedtime.        Follow-up Information    [facility] [place] Call.   Specialty:  General Surgery Why:  to confirm your follow up appointment date and time Contact information: [address] [place] [phone]          Signed: Jerre SimonJessica L Travonne Schowalter  [facility] 02/13/2017, 8:14 AM Pager: [phone] Consults: [phone] Mon-Fri 7:00 am-4:30 pm Sat-Sun 7:00 am-11:30 am

## 2017-02-13 NOTE — Progress Notes (Signed)
Discharge planning, no HH needs identified. [phone] 

## 2017-03-11 NOTE — Addendum Note (Signed)
Addendum  created 03/11/17 1428 by Denney Shein, MD   Sign clinical note    

## 2017-03-11 NOTE — Anesthesia Postprocedure Evaluation (Signed)
Anesthesia Post Note  Patient: Jennifer Reese  Procedure(s) Performed: Procedure(s) (LRB): LAPAROSCOPIC CHOLECYSTECTOMY (N/A)     Anesthesia Post Evaluation  Last Vitals:  Vitals:   02/13/17 0005 02/13/17 0450  BP: 135/61 (!) 148/63  Pulse: 84 93  Resp: 16 15  Temp: 36.8 C 36.8 C    Last Pain:  Vitals:   02/13/17 1031  TempSrc:   PainSc: 5                  Johannes Everage S

## 2017-08-14 ENCOUNTER — Other Ambulatory Visit: Payer: Self-pay | Admitting: Family Medicine

## 2017-08-14 DIAGNOSIS — Z1231 Encounter for screening mammogram for malignant neoplasm of breast: Secondary | ICD-10-CM

## 2017-08-28 ENCOUNTER — Other Ambulatory Visit: Payer: Self-pay | Admitting: Family Medicine

## 2017-08-28 ENCOUNTER — Ambulatory Visit (INDEPENDENT_AMBULATORY_CARE_PROVIDER_SITE_OTHER): Payer: BLUE CROSS/BLUE SHIELD

## 2017-08-28 DIAGNOSIS — Z1231 Encounter for screening mammogram for malignant neoplasm of breast: Secondary | ICD-10-CM | POA: Diagnosis not present

## 2017-11-01 ENCOUNTER — Encounter (HOSPITAL_COMMUNITY): Payer: Self-pay | Admitting: General Surgery

## 2018-08-07 ENCOUNTER — Other Ambulatory Visit: Payer: Self-pay | Admitting: Family Medicine

## 2018-08-07 DIAGNOSIS — Z1231 Encounter for screening mammogram for malignant neoplasm of breast: Secondary | ICD-10-CM

## 2018-09-03 ENCOUNTER — Ambulatory Visit (INDEPENDENT_AMBULATORY_CARE_PROVIDER_SITE_OTHER): Payer: BLUE CROSS/BLUE SHIELD

## 2018-09-03 DIAGNOSIS — Z1231 Encounter for screening mammogram for malignant neoplasm of breast: Secondary | ICD-10-CM | POA: Diagnosis not present

## 2019-10-05 ENCOUNTER — Other Ambulatory Visit: Payer: Self-pay | Admitting: Family Medicine

## 2019-10-05 DIAGNOSIS — Z1231 Encounter for screening mammogram for malignant neoplasm of breast: Secondary | ICD-10-CM

## 2019-10-07 ENCOUNTER — Ambulatory Visit (INDEPENDENT_AMBULATORY_CARE_PROVIDER_SITE_OTHER): Payer: BC Managed Care – PPO

## 2019-10-07 ENCOUNTER — Other Ambulatory Visit: Payer: Self-pay

## 2019-10-07 DIAGNOSIS — Z1231 Encounter for screening mammogram for malignant neoplasm of breast: Secondary | ICD-10-CM

## 2019-10-08 ENCOUNTER — Ambulatory Visit: Payer: BLUE CROSS/BLUE SHIELD

## 2020-09-16 ENCOUNTER — Other Ambulatory Visit: Payer: Self-pay | Admitting: Family Medicine

## 2020-09-16 DIAGNOSIS — Z1231 Encounter for screening mammogram for malignant neoplasm of breast: Secondary | ICD-10-CM

## 2020-10-12 ENCOUNTER — Other Ambulatory Visit: Payer: Self-pay

## 2020-10-12 ENCOUNTER — Ambulatory Visit (INDEPENDENT_AMBULATORY_CARE_PROVIDER_SITE_OTHER): Payer: Medicare Other

## 2020-10-12 DIAGNOSIS — Z1231 Encounter for screening mammogram for malignant neoplasm of breast: Secondary | ICD-10-CM | POA: Diagnosis not present

## 2021-07-10 ENCOUNTER — Encounter: Payer: Self-pay | Admitting: Orthopaedic Surgery

## 2021-07-10 ENCOUNTER — Ambulatory Visit: Payer: Self-pay

## 2021-07-10 ENCOUNTER — Ambulatory Visit: Payer: Medicare Other | Admitting: Orthopaedic Surgery

## 2021-07-10 VITALS — Ht 61.0 in | Wt 219.0 lb

## 2021-07-10 DIAGNOSIS — M25551 Pain in right hip: Secondary | ICD-10-CM

## 2021-07-10 DIAGNOSIS — M5441 Lumbago with sciatica, right side: Secondary | ICD-10-CM

## 2021-07-10 MED ORDER — TIZANIDINE HCL 4 MG PO TABS: 4.0000 mg | ORAL_TABLET | Freq: Two times a day (BID) | ORAL | 1 refills | Status: DC | PRN

## 2021-07-10 MED ORDER — MELOXICAM 15 MG PO TABS: 15.0000 mg | ORAL_TABLET | Freq: Every day | ORAL | 2 refills | Status: AC | PRN

## 2021-07-10 NOTE — Progress Notes (Signed)
Office Visit Note   Patient: Jennifer Reese           Date of Birth: 03-13-1956           MRN: [id number] Visit Date: 07/10/2021              Requested by: Richmond Campbell., PA-C [address],  [place] [address] PCP: Richmond Campbell., PA-C   Assessment & Plan: Visit Diagnoses:  1. Pain in right hip   2. Acute right-sided low back pain with right-sided sciatica     Plan: Her signs and symptoms are consistent with low back pain to the right side and sciatica.  I will continue her meloxicam and Zanaflex and will send her to outpatient physical therapy for McKenzie exercises and other modalities that can decrease her sciatica.  We will see her back in 4 weeks to see how she is doing overall.  My next step will be considering a MRI of the lumbar spine if her sciatic symptoms continue.  Follow-Up Instructions: Return in about 4 weeks (around 08/07/2021).   Orders:  Orders Placed This Encounter  Procedures   XR HIP UNILAT W OR W/O PELVIS 1V RIGHT   Meds ordered this encounter  Medications   tiZANidine (ZANAFLEX) 4 MG tablet    Sig: Take 1 tablet (4 mg total) by mouth 2 (two) times daily as needed for muscle spasms.    Dispense:  60 tablet    Refill:  1   meloxicam (MOBIC) 15 MG tablet    Sig: Take 1 tablet (15 mg total) by mouth daily as needed for pain.    Dispense:  30 tablet    Refill:  2      Procedures: No procedures performed   Clinical Data: No additional findings.   Subjective: Chief Complaint  Patient presents with   Right Hip - Pain  The patient is a 65 year old female who comes in with right-sided low back pain and sciatica.  She denies any groin pain.  This is been going on since May.  Is began after walking [place] sand [place] the beach.  Steroid taper really helped things calm down but then her sciatic symptoms came back.  She is not a diabetic.  Her BMI is 41.38.  She has tried tenacity and and meloxicam and needs a refill of both of these.  She  has not had any formal physical therapy.  She denies any change in bowel bladder function or weakness in her legs or numbness and tingling in her feet.  HPI  Review of Systems She currently denies a headache, chest pain, shortness of breath, fever, chills, nausea, vomiting  Objective: Vital Signs: Ht 5\' 1"  (1.549 m)   Wt 219 lb (99.3 kg)   BMI 41.38 kg/m   Physical Exam She is alert and orient x3 and in no acute distress Ortho Exam Examination of her right hip is entirely normal.  She has full and fluid range of motion of the right hip with no pain in the groin and no blocks or rotation.  There is no pain of the trochanteric area of her right hip.  Her pain is in the sciatic region and there is low back pain to the right side and pain on sciatic stretch. Specialty Comments:  No specialty comments available.  Imaging: XR HIP UNILAT W OR W/O PELVIS 1V RIGHT  Result Date: 07/10/2021 An AP pelvis and lateral right hip shows normal-appearing hip bilaterally.  PMFS History: Patient Active Problem List   Diagnosis Date Noted   Acute calculous cholecystitis 02/12/2017   Past Medical History:  Diagnosis Date   Hypertension     Family History  Problem Relation Age of Onset   Mental illness Mother    Cancer Father    Cancer Brother    Diabetes Other     Past Surgical History:  Procedure Laterality Date   CHOLECYSTECTOMY N/A 02/12/2017   Procedure: LAPAROSCOPIC CHOLECYSTECTOMY;  Surgeon: Kinsinger, De Blanch, MD;  Location: [facility];  Service: General;  Laterality: N/A;   Social History   Occupational History   Not on file  Tobacco Use   Smoking status: Never   Smokeless tobacco: Never  Substance and Sexual Activity   Alcohol use: No   Drug use: No   Sexual activity: Not on file

## 2021-07-13 ENCOUNTER — Telehealth: Payer: Self-pay | Admitting: Orthopaedic Surgery

## 2021-07-13 ENCOUNTER — Other Ambulatory Visit: Payer: Self-pay

## 2021-07-13 DIAGNOSIS — M5441 Lumbago with sciatica, right side: Secondary | ICD-10-CM

## 2021-07-13 NOTE — Telephone Encounter (Signed)
The order was missed. Referral now put in. Can you please call her to schedule. Thank you

## 2021-07-13 NOTE — Telephone Encounter (Signed)
Pt calling checking on the status of referral she was getting. Pt states Dr. Magnus Ivan was going to send her to an outpatient rehab before following up with [place] after. She wanted to make sure she did not miss anything or if there was anything on her side she needed to do to push that along. The best call back number is [phone].

## 2021-07-26 ENCOUNTER — Ambulatory Visit: Payer: Medicare Other | Admitting: Rehabilitative and Restorative Service Providers"

## 2021-08-02 ENCOUNTER — Other Ambulatory Visit: Payer: Self-pay | Admitting: Orthopaedic Surgery

## 2021-08-07 ENCOUNTER — Encounter: Payer: Self-pay | Admitting: Orthopaedic Surgery

## 2021-08-07 ENCOUNTER — Other Ambulatory Visit: Payer: Self-pay

## 2021-08-07 ENCOUNTER — Ambulatory Visit: Payer: Medicare Other | Admitting: Orthopaedic Surgery

## 2021-08-07 DIAGNOSIS — M5441 Lumbago with sciatica, right side: Secondary | ICD-10-CM | POA: Diagnosis not present

## 2021-08-07 NOTE — Progress Notes (Signed)
The patient is being seen in follow-up for right-sided sciatica.  She has not been able to go to therapy yet but does started coming up.  She also is retiring from her work on 10 November.  She is 65 years old.  I saw her 4 weeks ago for the sciatic symptoms.  I put her on a muscle relaxant medication to take at night.  She said that is helping and things have not gotten worse for her.  Today she denies any weakness in her legs or numbness and tingling.  She has negative straight leg raise on the right side and excellent strength in her bilateral lower extremities.  I will hold off on MRI since her symptoms are not worsened and wait to see how she does with physical therapy.  She agrees with this treatment plan.  We will see her back in 6 weeks to see how she is done with physical therapy due to her low back pain and sciatica.

## 2021-08-11 ENCOUNTER — Ambulatory Visit: Payer: Medicare Other | Attending: Orthopaedic Surgery | Admitting: Physical Therapy

## 2021-08-11 ENCOUNTER — Other Ambulatory Visit: Payer: Self-pay

## 2021-08-11 ENCOUNTER — Encounter: Payer: Self-pay | Admitting: Physical Therapy

## 2021-08-11 DIAGNOSIS — G8929 Other chronic pain: Secondary | ICD-10-CM | POA: Insufficient documentation

## 2021-08-11 DIAGNOSIS — M6281 Muscle weakness (generalized): Secondary | ICD-10-CM | POA: Diagnosis present

## 2021-08-11 DIAGNOSIS — M62838 Other muscle spasm: Secondary | ICD-10-CM | POA: Insufficient documentation

## 2021-08-11 DIAGNOSIS — M5441 Lumbago with sciatica, right side: Secondary | ICD-10-CM | POA: Insufficient documentation

## 2021-08-11 NOTE — Patient Instructions (Signed)
Access Code: [email or web address] URL: [email or web address] Date: 08/11/2021 Prepared by: Harrie Foreman  Exercises Supine Bridge - 1 x daily - 7 x weekly - 2 sets - 10 reps Prone Press Up - 1 x daily - 7 x weekly - 1 sets - 10 reps Beginner Prone Single Leg Raise - 1 x daily - 7 x weekly - 2 sets - 10 reps Lateral Shift Correction at Wall - 3 x daily - 7 x weekly - 1 sets - 10 reps Standing Lumbar Extension at Wall - Forearms - 3 x daily - 7 x weekly - 1 sets - 10 reps

## 2021-08-11 NOTE — Therapy (Signed)
[facility] [address]  [address] [place], [place], [address] Phone: [phone]   Fax:  [phone]  Physical Therapy Evaluation  Patient Details  Name: Jennifer Reese MRN: [id number] Date of Birth: 21-Jun-1956 Referring Provider (PT): Doneen Poisson MD   Encounter Date: 08/11/2021   PT End of Session - 08/11/21 1028     Visit Number 1    Number of Visits 12    Date for PT Re-Evaluation 09/22/21    Authorization Type [employer]    Progress Note Due on Visit 10    PT Start Time 0840    PT Stop Time 0930    PT Time Calculation (min) 50 min    Activity Tolerance Patient tolerated treatment well    Behavior During Therapy [facility] for tasks assessed/performed             Past Medical History:  Diagnosis Date   Hypertension     Past Surgical History:  Procedure Laterality Date   CHOLECYSTECTOMY N/A 02/12/2017   Procedure: LAPAROSCOPIC CHOLECYSTECTOMY;  Surgeon: Rodman Pickle, MD;  Location: [facility];  Service: General;  Laterality: N/A;    There were no vitals filed for this visit.    Subjective Assessment - 08/11/21 0854     Subjective Patient reports R sided sciatica started in July, noted that walking on beach provoked pain, and then later hiking in mountains.  Denies any falls but lost confidence in balance due to pain, worried that it was her hip.  She saw MD, imaging was negative, and has been improving, so she said he decided to not do an MRI and refer to PT instead.  She sits all day for work, and needs to get up and move around, but is retiring next week.  She enjoys walking/hiking and looking foward to being more active.  She would like exercises to help her back.    Pertinent History history chronic LBP, HTN, arthritis, obesity    Limitations Sitting   sleeping   How long can you sit comfortably? 6 hours    How long can you stand comfortably? 4 hours    How long can you walk comfortably? 3-4  hours    Diagnostic tests 10/3- XR An AP pelvis and lateral right hip shows normal-appearing hip bilaterally.    Patient Stated Goals decrease pain and have exercises to help self    Currently in Pain? Yes    Pain Score 0-No pain   5/10 at night, takes pain medication   Pain Location Back    Pain Orientation Lower;Right    Pain Descriptors / Indicators Sharp    Pain Type Acute pain    Pain Radiating Towards around down back of thigh to foot    Pain Onset More than a month ago   July 2022   Pain Frequency Intermittent    Aggravating Factors  sitting long periods, lifting, cleaning (bending foward), sleeping (laying down)    Pain Relieving Factors warm water, pain medication, walking    Effect of Pain on Daily Activities limiting lifting, uncomfortable                [facility] PT Assessment - 08/11/21 0001       Assessment   Medical Diagnosis M54.41 acute right sided low back pain with right sided sciatica    Referring Provider (PT) Doneen Poisson MD    Onset Date/Surgical Date --   July 2022   Hand Dominance Right  Next MD Visit Sep 18, 2021    Prior Therapy no      Precautions   Precautions None      Restrictions   Weight Bearing Restrictions No      Balance Screen   Has the patient fallen in the past 6 months No    Has the patient had a decrease in activity level because of a fear of falling?  Yes   when pain is severe, but not currently   Is the patient reluctant to leave their home because of a fear of falling?  No      Home Environment   Living Environment Private residence    Living Arrangements Alone    Type of Home House    Home Access Stairs to enter    Entrance Stairs-Number of Steps 2   3 in back   Home Layout One level      Prior Function   Level of Independence Independent    Vocation Full time employment   retiring in one week   Vocation Requirements sitting long periods    Leisure taking walks      Cognition   Overall Cognitive Status  Within Functional Limits for tasks assessed      Observation/Other Assessments   Observations Patient enters independently without device or deviation, no apparent distress.    Focus on Therapeutic Outcomes (FOTO)  lumbar 57      Sensation   Additional Comments denies numbness/tingling in RLE      Posture/Postural Control   Posture/Postural Control No significant limitations      ROM / Strength   AROM / PROM / Strength AROM;PROM;Strength      AROM   Overall AROM  Within functional limits for tasks performed    Overall AROM Comments good lumbar ROM without pain, prefers extension    AROM Assessment Site Lumbar    Lumbar Flexion can touch midshin    Lumbar Extension WNL, "feels good"    Lumbar - Right Side Bend to knee    Lumbar - Left Side Bend to knee    Lumbar - Right Rotation WNL no pain    Lumbar - Left Rotation WNL no pain      PROM   Overall PROM  Deficits    Overall PROM Comments no pain but tightness R hip with external rotation      Strength   Overall Strength Within functional limits for tasks performed    Overall Strength Comments tested in sitting, no deficits except R hip flexor weaknes 4/5, and decreased hip extension strength bil      Flexibility   Soft Tissue Assessment /Muscle Length yes    Hamstrings slight tightness, SLR to 85 bil    Quadriceps tight, 90 knee flexion in prone    Piriformis tight R side    Quadratus Lumborum tight R side      Palpation   Spinal mobility decreased mobility throughout lower thoracic and lumbar spine, but no pain    SI assessment  tenderness R SIJ    Palpation comment tenderness R piriformis      Special Tests   Other special tests negative SLR, negative hip scour, negative FABER bil, negative supine to long sit test.      Ambulation/Gait   Gait Comments no device or deviation                        Objective measurements completed on examination: See  above findings.       [facility] Adult PT  Treatment/Exercise - 08/11/21 0001       Self-Care   Self-Care Heat/Ice Application    Heat/Ice Application see patient instructions      Lumbar Exercises: Stretches   Standing Side Bend 5 reps    Standing Side Bend Limitations left hip facing wall, leaning on wall    Standing Extension 10 reps    Standing Extension Limitations leaning on wall    Press Ups 5 reps    Press Ups Limitations to tolerance, stopped increased tightness in R buttock      Lumbar Exercises: Supine   Bridge 10 reps    Bridge Limitations with TrA contraction      Lumbar Exercises: Prone   Straight Leg Raise 5 reps    Straight Leg Raises Limitations bil, fatiguing                     PT Education - 08/11/21 0932     Education Details Access Code: [email or web address].   Educated to try CP to R buttock and self massage with tennis ball.  also educated on findings, POC.    Person(s) Educated Patient    Methods Explanation;Demonstration;Verbal cues;Handout    Comprehension Verbalized understanding;Returned demonstration              PT Short Term Goals - 08/11/21 1035       PT SHORT TERM GOAL #1   Title Patient will be independent with initial HEP.    Time 2    Period Weeks    Status New    Target Date 08/25/21               PT Long Term Goals - 08/11/21 1042       PT LONG TERM GOAL #1   Title Patient will be independent with progressed HEP to improve outcomes.    Time 6    Period Weeks    Status New    Target Date 09/22/21      PT LONG TERM GOAL #2   Title Patient will report centralization of radicular symptoms in R leg.    Baseline pain radiates down R leg to foot.    Time 6    Period Weeks    Status New    Target Date 09/22/21      PT LONG TERM GOAL #3   Title Patient will report 75% improvement in severity/incidence of R sciatic pain to improve QOL.    Time 6    Period Weeks    Status New    Target Date 09/22/21      PT LONG TERM GOAL #4   Title Patient will be  able to complete all desired activities without limitation from R sciatic pain.    Time 6    Period Weeks    Status New    Target Date 09/22/21                    Plan - 08/11/21 1029     Clinical Impression Statement Ms. Jennifer Reese is a 65 year old female referred for R sided low back pain with R sided sciatica.  Examination consistent with referral.  She demonstrates tenderness/tightness localized over R piriformis today,  strong extension preference for movement, and slightly decreased R hip flexor weakness.  She would benefit from skilled physical therapy to decrease pain, improve tolerance to exercise, and improve QOL.  She was  instructed in initial HEP today for McKenzie based extension exercises and lumbar/glut strengthening.  Also recommended trying cold pack to R buttock to decrease inflammation/pain rather than heat.    Personal Factors and Comorbidities Comorbidity 2    Comorbidities arthritis, hypertension, obesity    Examination-Activity Limitations Lift;Sit;Sleep    Examination-Participation Restrictions Shop;Occupation    Stability/Clinical Decision Making Evolving/Moderate complexity    Clinical Decision Making Low    Rehab Potential Excellent    PT Frequency 2x / week    PT Duration 6 weeks    PT Treatment/Interventions ADLs/Self Care Home Management;Cryotherapy;Electrical Stimulation;Moist Heat;Traction;Ultrasound;Gait training;Stair training;Functional mobility training;Therapeutic activities;Therapeutic exercise;Balance training;Neuromuscular re-education;Passive range of motion;Dry needling;Manual techniques;Taping;Spinal Manipulations;Joint Manipulations    PT Next Visit Plan review and progress HEP, core strengthening exercises, piriformis stretch, manual therapy including DN, modalities PRN    PT Home Exercise Plan [email or web address]    Consulted and Agree with Plan of Care Patient             Patient will benefit from skilled therapeutic intervention in order  to improve the following deficits and impairments:  Decreased activity tolerance, Decreased range of motion, Decreased strength, Increased fascial restricitons, Pain, Decreased mobility, Increased muscle spasms, Impaired flexibility  Visit Diagnosis: Chronic right-sided low back pain with right-sided sciatica  Other muscle spasm  Muscle weakness (generalized)     Problem List Patient Active Problem List   Diagnosis Date Noted   Acute calculous cholecystitis 02/12/2017    Jena Gauss, PT, DPT 08/11/2021, 11:06 AM  [facility] [address]  [address] [place], [place], [address] Phone: [phone]   Fax:  [phone]  Name: Jennifer Reese MRN: [id number] Date of Birth: 06/09/1956

## 2021-08-15 ENCOUNTER — Ambulatory Visit: Payer: Medicare Other | Admitting: Physical Therapy

## 2021-08-15 ENCOUNTER — Other Ambulatory Visit: Payer: Self-pay

## 2021-08-15 DIAGNOSIS — M62838 Other muscle spasm: Secondary | ICD-10-CM

## 2021-08-15 DIAGNOSIS — M6281 Muscle weakness (generalized): Secondary | ICD-10-CM

## 2021-08-15 DIAGNOSIS — G8929 Other chronic pain: Secondary | ICD-10-CM

## 2021-08-15 DIAGNOSIS — M5441 Lumbago with sciatica, right side: Secondary | ICD-10-CM

## 2021-08-15 NOTE — Therapy (Signed)
[facility] [address]  [place], [place], [address] Phone: [phone]   Fax:  [phone]  Physical Therapy Treatment  Patient Details  Name: Jennifer Reese MRN: [id number] Date of Birth: 05/30/1956 Referring Provider (PT): Jean Rosenthal MD   Encounter Date: 08/15/2021   PT End of Session - 08/15/21 0852     Visit Number 2    Number of Visits 12    Date for PT Re-Evaluation 09/22/21    Authorization Type [employer]    Progress Note Due on Visit 10    PT Start Time [phone]    PT Stop Time 0930    PT Time Calculation (min) 41 min    Activity Tolerance Patient tolerated treatment well    Behavior During Therapy [facility] for tasks assessed/performed             Past Medical History:  Diagnosis Date   Hypertension     Past Surgical History:  Procedure Laterality Date   CHOLECYSTECTOMY N/A 02/12/2017   Procedure: LAPAROSCOPIC CHOLECYSTECTOMY;  Surgeon: Mickeal Skinner, MD;  Location: [facility];  Service: General;  Laterality: N/A;    There were no vitals filed for this visit.   Subjective Assessment - 08/15/21 0851     Subjective Patient reports she is doing much better, those stretches helped.  Even cleaned out freezer yesterday and did some yard work.  Retiring on Thursday.    Pertinent History history chronic LBP, HTN, arthritis, obesity    Limitations Sitting   sleeping   How long can you sit comfortably? 6 hours    How long can you stand comfortably? 4 hours    How long can you walk comfortably? 3-4 hours    Diagnostic tests 10/3- XR An AP pelvis and lateral right hip shows normal-appearing hip bilaterally.    Patient Stated Goals decrease pain and have exercises to help self    Currently in Pain? No/denies    Pain Location Back    Pain Orientation Right;Lower    Pain Descriptors / Indicators Tightness    Pain Onset More than a month ago   July 2022                               [facility] Adult PT Treatment/Exercise - 08/15/21 0001       Lumbar Exercises: Stretches   Lower Trunk Rotation Limitations    Lower Trunk Rotation Limitations x10 bil    Pelvic Tilt 10 reps    Standing Side Bend Limitations    Standing Side Bend Limitations 10 reps, L side to wall    Standing Extension 10 reps    Standing Extension Limitations at wall    Press Ups 10 reps      Lumbar Exercises: Aerobic   Nustep L5 x 6 min      Lumbar Exercises: Supine   Bridge 10 reps    Bridge Limitations with TrA contraction    Bridge with [employer] 20 reps    Bridge with [employer] Limitations cues for TrA contraction and breathing.    Bridge with clamshell 20 reps    Bridge with [employer] Limitations RTB, keeping hips abducted      Lumbar Exercises: Sidelying   Clam Both;20 reps    Clam Limitations RTB, cues for technique      Lumbar Exercises: Prone   Straight Leg Raise 10 reps  Straight Leg Raises Limitations bil      Manual Therapy   Manual Therapy Soft tissue mobilization;Joint mobilization;Myofascial release    Manual therapy comments to back to decrease pain/spasm and centralize symptoms.    Joint Mobilization R UPA mobs to lumbar spine grade 2-3    Soft tissue mobilization IASTM with foam roller to R glutes/piriformis, QL, and lumbar paraspinals    Myofascial Release TPR to R piriformis/glutes                     PT Education - 08/15/21 0931     Education Details Access Code: [email or web address]  HEP update, issued RTB    Person(s) Educated Patient    Methods Explanation;Demonstration;Handout;Verbal cues    Comprehension Verbalized understanding;Returned demonstration              PT Short Term Goals - 08/15/21 0856       PT SHORT TERM GOAL #1   Title Patient will be independent with initial HEP.    Time 2    Period Weeks    Status Achieved   08/15/21- met.   Target Date 08/25/21               PT Long Term  Goals - 08/15/21 0856       PT LONG TERM GOAL #1   Title Patient will be independent with progressed HEP to improve outcomes.    Time 6    Period Weeks    Status On-going    Target Date 09/22/21      PT LONG TERM GOAL #2   Title Patient will report centralization of radicular symptoms in R leg.    Baseline pain radiates down R leg to foot.    Time 6    Period Weeks    Status On-going   08/15/21- no radicular symptoms, continue to monitor.   Target Date 09/22/21      PT LONG TERM GOAL #3   Title Patient will report 75% improvement in severity/incidence of R sciatic pain to improve QOL.    Time 6    Period Weeks    Status On-going    Target Date 09/22/21      PT LONG TERM GOAL #4   Title Patient will be able to complete all desired activities without limitation from R sciatic pain.    Time 6    Period Weeks    Status On-going    Target Date 09/22/21                   Plan - 08/15/21 0852     Clinical Impression Statement Patient reports significant improvement since IE and good compliance with initial HEP, meeting STG #1.  Reports no radicular symptoms.  Today reviewed and progressed HEP, all exercises tolerated well although noted poor eccentric control with PPT and bridges.  Manual therapy at end of session, noted decreased muscle tension in R glutes/piriformis.  No pain reported at end of session.  She would benefit from continued skilled therapy.    Personal Factors and Comorbidities Comorbidity 2    Comorbidities arthritis, hypertension, obesity    Examination-Activity Limitations Lift;Sit;Sleep    Examination-Participation Restrictions Shop;Occupation    Stability/Clinical Decision Making Evolving/Moderate complexity    Rehab Potential Excellent    PT Frequency 2x / week    PT Duration 6 weeks    PT Treatment/Interventions ADLs/Self Care Home Management;Cryotherapy;Electrical Stimulation;Moist Heat;Traction;Ultrasound;Gait training;Stair training;Functional  mobility training;Therapeutic activities;Therapeutic exercise;Balance training;Neuromuscular re-education;Passive range  of motion;Dry needling;Manual techniques;Taping;Spinal Manipulations;Joint Manipulations    PT Next Visit Plan review and progress HEP, core strengthening exercises, piriformis stretch, manual therapy including DN, modalities PRN    PT Home Exercise Plan [email or web address]    Consulted and Agree with Plan of Care Patient             Patient will benefit from skilled therapeutic intervention in order to improve the following deficits and impairments:  Decreased activity tolerance, Decreased range of motion, Decreased strength, Increased fascial restricitons, Pain, Decreased mobility, Increased muscle spasms, Impaired flexibility  Visit Diagnosis: Chronic right-sided low back pain with right-sided sciatica  Other muscle spasm  Muscle weakness (generalized)     Problem List Patient Active Problem List   Diagnosis Date Noted   Acute calculous cholecystitis 02/12/2017    Rennie Natter, PT, DPT 08/15/2021, 11:46 AM  [facility] [address]  [place], [place], [address] Phone: [phone]   Fax:  [phone]  Name: Jennifer Reese MRN: [id number] Date of Birth: [birth year]

## 2021-08-15 NOTE — Patient Instructions (Signed)
Access Code: [email or web address] URL: [email or web address] Date: 08/15/2021 Prepared by: Harrie Foreman  Exercises Supine Bridge - 1 x daily - 7 x weekly - 2 sets - 10 reps Prone Press Up - 1 x daily - 7 x weekly - 1 sets - 10 reps Beginner Prone Single Leg Raise - 1 x daily - 7 x weekly - 2 sets - 10 reps Lateral Shift Correction at Wall - 3 x daily - 7 x weekly - 1 sets - 10 reps Standing Lumbar Extension at Wall - Forearms - 3 x daily - 7 x weekly - 1 sets - 10 reps Supine Posterior Pelvic Tilt - 1 x daily - 7 x weekly - 2 sets - 10 reps - 5 sec hold Supine Bridge with Mini Swiss Ball Between Knees - 1 x daily - 7 x weekly - 2 sets - 10 reps Supine Bridge with Resistance Band - 1 x daily - 7 x weekly - 2 sets - 10 reps Clamshell with Resistance - 1 x daily - 7 x weekly - 2 sets - 10 reps

## 2021-08-18 ENCOUNTER — Other Ambulatory Visit: Payer: Self-pay

## 2021-08-18 ENCOUNTER — Ambulatory Visit: Payer: Medicare Other | Admitting: Physical Therapy

## 2021-08-18 DIAGNOSIS — G8929 Other chronic pain: Secondary | ICD-10-CM

## 2021-08-18 DIAGNOSIS — M5441 Lumbago with sciatica, right side: Secondary | ICD-10-CM

## 2021-08-18 DIAGNOSIS — M6281 Muscle weakness (generalized): Secondary | ICD-10-CM

## 2021-08-18 DIAGNOSIS — M62838 Other muscle spasm: Secondary | ICD-10-CM

## 2021-08-18 NOTE — Patient Instructions (Signed)
Access Code: [email or web address] URL: [email or web address] Date: 08/18/2021 Prepared by: Harrie Foreman  Exercises Supine March - 1 x daily - 7 x weekly - 2 sets - 10 reps Bird Dog - 1 x daily - 7 x weekly - 2 sets - 10 reps Child's Pose Stretch - 1 x daily - 7 x weekly - 2 sets - 10 reps Cat Cow - 1 x daily - 7 x weekly - 2 sets - 10 reps

## 2021-08-18 NOTE — Therapy (Signed)
[facility] [address]  [place], [place], [address] Phone: [phone]   Fax:  [phone]  Physical Therapy Treatment  Patient Details  Name: Jennifer Reese MRN: [id number] Date of Birth: Oct 24, 1955 Referring Provider (PT): Jean Rosenthal MD   Encounter Date: 08/18/2021   PT End of Session - 08/18/21 0855     Visit Number 3    Number of Visits 12    Date for PT Re-Evaluation 09/22/21    Authorization Type [employer]    Progress Note Due on Visit 10    PT Start Time [phone]    PT Stop Time 0930    PT Time Calculation (min) 41 min    Activity Tolerance Patient tolerated treatment well    Behavior During Therapy [facility] for tasks assessed/performed             Past Medical History:  Diagnosis Date   Hypertension     Past Surgical History:  Procedure Laterality Date   CHOLECYSTECTOMY N/A 02/12/2017   Procedure: LAPAROSCOPIC CHOLECYSTECTOMY;  Surgeon: Mickeal Skinner, MD;  Location: [facility];  Service: General;  Laterality: N/A;    There were no vitals filed for this visit.   Subjective Assessment - 08/18/21 0852     Subjective Patient reports she is doing well today.  She retired yesterday.    Pertinent History history chronic LBP, HTN, arthritis, obesity    Limitations Sitting   sleeping   How long can you sit comfortably? 6 hours    How long can you stand comfortably? 4 hours    How long can you walk comfortably? 3-4 hours    Diagnostic tests 10/3- XR An AP pelvis and lateral right hip shows normal-appearing hip bilaterally.    Patient Stated Goals decrease pain and have exercises to help self    Currently in Pain? No/denies    Pain Onset More than a month ago   July 2022                              [facility] Adult PT Treatment/Exercise - 08/18/21 0001       Exercises   Exercises Lumbar      Lumbar Exercises: Stretches   Lower Trunk Rotation Limitations x10 bil     Pelvic Tilt 20 reps    Pelvic Tilt Limitations belt added under low back for tactile feedback    Lumbar Stabilization Level 1 Limitations PPT with marching, using belt for tactile feed back 3 x 10 with cues to slow down.    Other Lumbar Stretch Exercise cat/cow x 10, VC needed    Other Lumbar Stretch Exercise child pose x 3      Lumbar Exercises: Aerobic   Nustep L5 x 6 min      Lumbar Exercises: Supine   Bridge 20 reps    Bridge Limitations with TrA contraction    Bridge with [employer] 20 reps    Bridge with [employer] Limitations cues for TrA contraction and breathing.      Lumbar Exercises: Quadruped   Opposite Arm/Leg Raise Right arm/Left leg;Left arm/Right leg;10 reps    Opposite Arm/Leg Raise Limitations cues for technique      Manual Therapy   Manual Therapy Soft tissue mobilization    Manual therapy comments to back to decrease pain/spasm and centralize symptoms.    Soft tissue mobilization IASTM with foam roller to R glutes/piriformis,  QL, and lumbar paraspinals                     PT Education - 08/18/21 1101     Education Details Access Code: [email or web address] HEP update    Person(s) Educated Patient    Methods Explanation;Demonstration;Handout;Other (comment)    Comprehension Verbalized understanding;Returned demonstration              PT Short Term Goals - 08/15/21 0856       PT SHORT TERM GOAL #1   Title Patient will be independent with initial HEP.    Time 2    Period Weeks    Status Achieved   08/15/21- met.   Target Date 08/25/21               PT Long Term Goals - 08/18/21 0856       PT LONG TERM GOAL #1   Title Patient will be independent with progressed HEP to improve outcomes.    Time 6    Period Weeks    Status On-going      PT LONG TERM GOAL #2   Title Patient will report centralization of radicular symptoms in R leg.    Baseline pain radiates down R leg to foot.    Time 6    Period Weeks    Status On-going   08/15/21-  no radicular symptoms, continue to monitor.  11/11- only has pain if "overdo"     PT LONG TERM GOAL #3   Title Patient will report 75% improvement in severity/incidence of R sciatic pain to improve QOL.    Time 6    Period Weeks    Status On-going      PT LONG TERM GOAL #4   Title Patient will be able to complete all desired activities without limitation from R sciatic pain.    Time 6    Period Weeks    Status On-going                   Plan - 08/18/21 1102     Clinical Impression Statement Patient continues to make good progress and reports no radicular syptms down her RLE and good compliance with HEP.  Focused today on improving PPT, using belt for tactile feed back and then progressing to march with abdominal bracing still using belt for tactile feedback, which she responded to well and demonstrated improved technique.  Progressed to quadruped exercises for core strengthening today.  We discussed decreasing frequency due to improvement and copay next week.  She would benefit from continued skilled therapy.    Personal Factors and Comorbidities Comorbidity 2    Comorbidities arthritis, hypertension, obesity    Examination-Activity Limitations Lift;Sit;Sleep    Examination-Participation Restrictions Shop;Occupation    Stability/Clinical Decision Making Evolving/Moderate complexity    Rehab Potential Excellent    PT Frequency 2x / week    PT Duration 6 weeks    PT Treatment/Interventions ADLs/Self Care Home Management;Cryotherapy;Electrical Stimulation;Moist Heat;Traction;Ultrasound;Gait training;Stair training;Functional mobility training;Therapeutic activities;Therapeutic exercise;Balance training;Neuromuscular re-education;Passive range of motion;Dry needling;Manual techniques;Taping;Spinal Manipulations;Joint Manipulations    PT Next Visit Plan review and progress HEP, core strengthening exercises, piriformis stretch, manual therapy including DN, modalities PRN    PT Home  Exercise Plan [email or web address]    Consulted and Agree with Plan of Care Patient             Patient will benefit from skilled therapeutic intervention in order to improve the following deficits and impairments:  Decreased activity tolerance, Decreased range of motion, Decreased strength, Increased fascial restricitons, Pain, Decreased mobility, Increased muscle spasms, Impaired flexibility  Visit Diagnosis: Chronic right-sided low back pain with right-sided sciatica  Other muscle spasm  Muscle weakness (generalized)     Problem List Patient Active Problem List   Diagnosis Date Noted   Acute calculous cholecystitis 02/12/2017    Rennie Natter, PT, DPT 08/18/2021, 11:05 AM  [facility] [address]  [place], [place], [address] Phone: [phone]   Fax:  [phone]  Name: Jennifer Reese MRN: [id number] Date of Birth: 1956/06/06

## 2021-08-22 ENCOUNTER — Encounter: Payer: Medicare Other | Admitting: Physical Therapy

## 2021-08-25 ENCOUNTER — Ambulatory Visit: Payer: Medicare Other | Admitting: Physical Therapy

## 2021-08-25 ENCOUNTER — Encounter: Payer: Self-pay | Admitting: Physical Therapy

## 2021-08-25 ENCOUNTER — Other Ambulatory Visit: Payer: Self-pay

## 2021-08-25 DIAGNOSIS — M5441 Lumbago with sciatica, right side: Secondary | ICD-10-CM | POA: Diagnosis not present

## 2021-08-25 DIAGNOSIS — M62838 Other muscle spasm: Secondary | ICD-10-CM

## 2021-08-25 DIAGNOSIS — G8929 Other chronic pain: Secondary | ICD-10-CM

## 2021-08-25 DIAGNOSIS — M6281 Muscle weakness (generalized): Secondary | ICD-10-CM

## 2021-08-25 NOTE — Patient Instructions (Signed)
Access Code: [email or web address] URL: [email or web address] Date: 08/25/2021 Prepared by: Harrie Foreman  Exercises Plank on Knees - 1 x daily - 7 x weekly - 1 sets - 3 reps - 30 hold Push-Up on Counter - 1 x daily - 7 x weekly - 2 sets - 10 reps Forward T with Counter Support - 1 x daily - 7 x weekly - 2 sets - 10 reps

## 2021-08-25 NOTE — Therapy (Signed)
[facility] [address]  [place], [place], [address] Phone: [phone]   Fax:  [phone]  Physical Therapy Treatment  Patient Details  Name: Jennifer Reese MRN: [id number] Date of Birth: 1956-05-02 Referring Provider (PT): Jean Rosenthal MD   Encounter Date: 08/25/2021   PT End of Session - 08/25/21 0938     Visit Number 4    Number of Visits 12    Date for PT Re-Evaluation 09/22/21    Authorization Type [employer]    Progress Note Due on Visit 10    PT Start Time 0930    PT Stop Time 1017    PT Time Calculation (min) 47 min    Activity Tolerance Patient tolerated treatment well    Behavior During Therapy [facility] for tasks assessed/performed             Past Medical History:  Diagnosis Date   Hypertension     Past Surgical History:  Procedure Laterality Date   CHOLECYSTECTOMY N/A 02/12/2017   Procedure: LAPAROSCOPIC CHOLECYSTECTOMY;  Surgeon: Mickeal Skinner, MD;  Location: [facility];  Service: General;  Laterality: N/A;    There were no vitals filed for this visit.   Subjective Assessment - 08/25/21 0935     Subjective Patient reports very good compliance with her HEP.  Has not taken any pain pills in 2 days.    Pertinent History history chronic LBP, HTN, arthritis, obesity    Limitations Sitting   sleeping   How long can you sit comfortably? 6 hours    How long can you stand comfortably? 4 hours    How long can you walk comfortably? 3-4 hours    Diagnostic tests 10/3- XR An AP pelvis and lateral right hip shows normal-appearing hip bilaterally.    Patient Stated Goals decrease pain and have exercises to help self    Currently in Pain? No/denies    Pain Onset More than a month ago   July 2022                              [facility] Adult PT Treatment/Exercise - 08/25/21 0001       Exercises   Exercises Lumbar;Shoulder      Lumbar Exercises: Aerobic   Nustep L5  x 6 min      Lumbar Exercises: Machines for Strengthening   Cybex Knee Extension 20# 1 x 10    Cybex Knee Flexion 20# 1 x 10    Leg Press 25# 2 x 10      Lumbar Exercises: Standing   Other Standing Lumbar Exercises Foward T's on counter x 10 bil    Other Standing Lumbar Exercises push-ups on counter x 10      Lumbar Exercises: Quadruped   Plank plank shoulder and knees 3 x 30 sec      Shoulder Exercises: ROM/Strengthening   Lat Pull Limitations 20# 2 x 10    Cybex Press Limitations 15# 2 x 10    Cybex Row Limitations 20# 2 x 10                     PT Education - 08/25/21 1207     Education Details Access Code: [email or web address] progressed HEP    Person(s) Educated Patient    Methods Explanation;Demonstration;Verbal cues;Handout    Comprehension Verbalized understanding;Returned demonstration  PT Short Term Goals - 08/15/21 0856       PT SHORT TERM GOAL #1   Title Patient will be independent with initial HEP.    Time 2    Period Weeks    Status Achieved   08/15/21- met.   Target Date 08/25/21               PT Long Term Goals - 08/25/21 1211       PT LONG TERM GOAL #1   Title Patient will be independent with progressed HEP to improve outcomes.    Time 6    Period Weeks    Status On-going   11/18- met for current, continue to progress.     PT LONG TERM GOAL #2   Title Patient will report centralization of radicular symptoms in R leg.    Baseline pain radiates down R leg to foot.    Time 6    Period Weeks    Status On-going   08/15/21- no radicular symptoms, continue to monitor.  11/11- only has pain if "overdo"  11/18- reports no pain and no pain medication x 2 days     PT LONG TERM GOAL #3   Title Patient will report 75% improvement in severity/incidence of R sciatic pain to improve QOL.    Time 6    Period Weeks    Status On-going   11/18- reports no pain medication needed x 2 days     PT LONG TERM GOAL #4   Title Patient will be  able to complete all desired activities without limitation from R sciatic pain.    Time 6    Period Weeks    Status On-going                   Plan - 08/25/21 1208     Clinical Impression Statement Patient reports good progress, she is compliant with her HEP and interested in becoming more active.  Started orienting her on weight machines today and discussing transition to gym.  She was able to complete all exercises without pain.  Also progressed HEP to including planks, counter push-ups and foward T's to increase body weight strengthening exercises.  She would benefit from continued skilled therapy.  Decreasing frequency to 1x/week due to progress.    Personal Factors and Comorbidities Comorbidity 2    Comorbidities arthritis, hypertension, obesity    Examination-Activity Limitations Lift;Sit;Sleep    Examination-Participation Restrictions Shop;Occupation    Stability/Clinical Decision Making Evolving/Moderate complexity    Rehab Potential Excellent    PT Frequency 2x / week    PT Duration 6 weeks    PT Treatment/Interventions ADLs/Self Care Home Management;Cryotherapy;Electrical Stimulation;Moist Heat;Traction;Ultrasound;Gait training;Stair training;Functional mobility training;Therapeutic activities;Therapeutic exercise;Balance training;Neuromuscular re-education;Passive range of motion;Dry needling;Manual techniques;Taping;Spinal Manipulations;Joint Manipulations    PT Next Visit Plan continue increasing resistance and exercises as tolerated, modalities PRN    PT Home Exercise Plan [email or web address].  [email or web address]    Consulted and Agree with Plan of Care Patient             Patient will benefit from skilled therapeutic intervention in order to improve the following deficits and impairments:  Decreased activity tolerance, Decreased range of motion, Decreased strength, Increased fascial restricitons, Pain, Decreased mobility, Increased muscle spasms, Impaired flexibility  Visit  Diagnosis: Chronic right-sided low back pain with right-sided sciatica  Other muscle spasm  Muscle weakness (generalized)     Problem List Patient Active Problem List   Diagnosis Date Noted  Acute calculous cholecystitis 02/12/2017    Rennie Natter, PT, DPT 08/25/2021, 12:13 PM  [facility] [address]  [place], [place], [address] Phone: [phone]   Fax:  [phone]  Name: Albana Saperstein MRN: [id number] Date of Birth: December 22, 1955

## 2021-08-26 ENCOUNTER — Other Ambulatory Visit: Payer: Self-pay | Admitting: Orthopaedic Surgery

## 2021-08-29 ENCOUNTER — Ambulatory Visit: Payer: Medicare Other

## 2021-09-05 ENCOUNTER — Ambulatory Visit: Payer: Medicare Other

## 2021-09-05 ENCOUNTER — Other Ambulatory Visit: Payer: Self-pay

## 2021-09-05 DIAGNOSIS — M6281 Muscle weakness (generalized): Secondary | ICD-10-CM

## 2021-09-05 DIAGNOSIS — G8929 Other chronic pain: Secondary | ICD-10-CM

## 2021-09-05 DIAGNOSIS — M62838 Other muscle spasm: Secondary | ICD-10-CM

## 2021-09-05 DIAGNOSIS — M5441 Lumbago with sciatica, right side: Secondary | ICD-10-CM | POA: Diagnosis not present

## 2021-09-05 NOTE — Therapy (Signed)
[facility] [address]  [place], [place], [address] Phone: [phone]   Fax:  [phone]  Physical Therapy Treatment  Patient Details  Name: Jennifer Reese MRN: [id number] Date of Birth: Aug 13, 1956 Referring Provider (PT): Jean Rosenthal MD   Encounter Date: 09/05/2021   PT End of Session - 09/05/21 0944     Visit Number 5    Number of Visits 12    Date for PT Re-Evaluation 09/22/21    Authorization Type [employer]    Progress Note Due on Visit 10    PT Start Time 0846    PT Stop Time 0934    PT Time Calculation (min) 48 min    Activity Tolerance Patient tolerated treatment well    Behavior During Therapy [facility] for tasks assessed/performed             Past Medical History:  Diagnosis Date   Hypertension     Past Surgical History:  Procedure Laterality Date   CHOLECYSTECTOMY N/A 02/12/2017   Procedure: LAPAROSCOPIC CHOLECYSTECTOMY;  Surgeon: Mickeal Skinner, MD;  Location: [facility];  Service: General;  Laterality: N/A;    There were no vitals filed for this visit.   Subjective Assessment - 09/05/21 0849     Subjective Pt reports no pain, has not been taking any pain pills, went shopping with no problems.    Pertinent History history chronic LBP, HTN, arthritis, obesity    Diagnostic tests 10/3- XR An AP pelvis and lateral right hip shows normal-appearing hip bilaterally.    Patient Stated Goals decrease pain and have exercises to help self    Currently in Pain? No/denies                               [facility] Adult PT Treatment/Exercise - 09/05/21 0001       Lumbar Exercises: Stretches   Other Lumbar Stretch Exercise fwd flexion stretch with orange Pball 10x3", CW/CCW weight shifts with hands supported on Pball 10x each      Lumbar Exercises: Aerobic   Recumbent Bike L2x65min      Lumbar Exercises: Machines for Strengthening   Other Lumbar Machine Exercise  standing lat pulls with 10lb 2x10    Other Lumbar Machine Exercise cybex row high grip 2x10, 15lb      Lumbar Exercises: Standing   Other Standing Lumbar Exercises trunk rotations 10x    Other Standing Lumbar Exercises multifidus walkouts with green TB 2x10, pallof presses with green TB 2x10                     PT Education - 09/05/21 0943     Education Details updated HEP    Person(s) Educated Patient    Methods Explanation;Demonstration;Verbal cues;Handout    Comprehension Verbalized understanding;Returned demonstration;Verbal cues required              PT Short Term Goals - 08/15/21 0856       PT SHORT TERM GOAL #1   Title Patient will be independent with initial HEP.    Time 2    Period Weeks    Status Achieved   08/15/21- met.   Target Date 08/25/21               PT Long Term Goals - 09/05/21 0928       PT LONG TERM GOAL #1   Title Patient will be  independent with progressed HEP to improve outcomes.    Time 6    Period Weeks    Status On-going   11/18- met for current, continue to progress.     PT LONG TERM GOAL #2   Title Patient will report centralization of radicular symptoms in R leg.    Baseline pain radiates down R leg to foot.    Time 6    Period Weeks    Status On-going   08/15/21- no radicular symptoms, continue to monitor.  11/11- only has pain if "overdo"  11/18- reports no pain and no pain medication x 2 days     PT LONG TERM GOAL #3   Title Patient will report 75% improvement in severity/incidence of R sciatic pain to improve QOL.    Time 6    Period Weeks    Status On-going   11/29- reports no pain medication needed x 10 days     PT LONG TERM GOAL #4   Title Patient will be able to complete all desired activities without limitation from R sciatic pain.    Time 6    Period Weeks    Status On-going   11/29- pt reports ability to walk around going shopping with no pain                  Plan - 09/05/21 0944      Clinical Impression Statement Pt continues to report improvements in LBP, noting that she has not used any pain medication in 10 days. Worked more on the upper part of the back today to improve posture. Cues needed throughout the session to keep shoulders depressed and to squeeze scapulae for max contraction. Brifely reviewed HEP and added seated flexion stretch as a warm up before exercises. Pt is still onboard with 1x/week frequency due to decreased pain levels and progress.    Personal Factors and Comorbidities Comorbidity 2    Comorbidities arthritis, hypertension, obesity    PT Frequency 2x / week    PT Duration 6 weeks    PT Treatment/Interventions ADLs/Self Care Home Management;Cryotherapy;Electrical Stimulation;Moist Heat;Traction;Ultrasound;Gait training;Stair training;Functional mobility training;Therapeutic activities;Therapeutic exercise;Balance training;Neuromuscular re-education;Passive range of motion;Dry needling;Manual techniques;Taping;Spinal Manipulations;Joint Manipulations    PT Next Visit Plan continue increasing resistance and exercises as tolerated, modalities PRN    PT Home Exercise Plan [email or web address].  [email or web address]    Consulted and Agree with Plan of Care Patient             Patient will benefit from skilled therapeutic intervention in order to improve the following deficits and impairments:  Decreased activity tolerance, Decreased range of motion, Decreased strength, Increased fascial restricitons, Pain, Decreased mobility, Increased muscle spasms, Impaired flexibility  Visit Diagnosis: Chronic right-sided low back pain with right-sided sciatica  Other muscle spasm  Muscle weakness (generalized)     Problem List Patient Active Problem List   Diagnosis Date Noted   Acute calculous cholecystitis 02/12/2017    Artist Pais, PTA 09/05/2021, 9:52 AM  [facility] [address]  [place], [place],  [address] Phone: [phone]   Fax:  [phone]  Name: Biannca Scantlin MRN: [id number] Date of Birth: 06-29-1956

## 2021-09-08 ENCOUNTER — Encounter: Payer: Medicare Other | Admitting: Physical Therapy

## 2021-09-12 ENCOUNTER — Other Ambulatory Visit: Payer: Self-pay

## 2021-09-12 ENCOUNTER — Ambulatory Visit: Payer: Medicare Other | Attending: Orthopaedic Surgery

## 2021-09-12 DIAGNOSIS — M5441 Lumbago with sciatica, right side: Secondary | ICD-10-CM | POA: Insufficient documentation

## 2021-09-12 DIAGNOSIS — M62838 Other muscle spasm: Secondary | ICD-10-CM | POA: Insufficient documentation

## 2021-09-12 DIAGNOSIS — M6281 Muscle weakness (generalized): Secondary | ICD-10-CM | POA: Insufficient documentation

## 2021-09-12 DIAGNOSIS — G8929 Other chronic pain: Secondary | ICD-10-CM | POA: Insufficient documentation

## 2021-09-12 NOTE — Patient Instructions (Signed)
Access Code: [email or web address] URL: [email or web address] Date: 09/12/2021 Prepared by: Verta Ellen  Exercises Seated Flexion Stretch with Swiss Ball - 2 x daily - 7 x weekly - 2 sets - 2 reps - 15-30 sec hold Standing Anti-Rotation Press with Anchored Resistance - 1 x daily - 7 x weekly - 3 sets - 10 reps Resistance Pulldown with March - 1 x daily - 7 x weekly - 3 sets - 10 reps

## 2021-09-12 NOTE — Therapy (Signed)
[facility] [address]  [address] [place], [place], [address] Phone: [phone]   Fax:  [phone]  Physical Therapy Treatment  Patient Details  Name: Jennifer Reese MRN: [id number] Date of Birth: May 29, 1956 Referring Provider (PT): Doneen Poisson MD   Encounter Date: 09/12/2021   PT End of Session - 09/12/21 0934     Visit Number 6    Number of Visits 12    Date for PT Re-Evaluation 09/22/21    Authorization Type [employer]    Progress Note Due on Visit 10    PT Start Time 0847    PT Stop Time 0929    PT Time Calculation (min) 42 min    Activity Tolerance Patient tolerated treatment well    Behavior During Therapy [facility] for tasks assessed/performed             Past Medical History:  Diagnosis Date   Hypertension     Past Surgical History:  Procedure Laterality Date   CHOLECYSTECTOMY N/A 02/12/2017   Procedure: LAPAROSCOPIC CHOLECYSTECTOMY;  Surgeon: Rodman Pickle, MD;  Location: [facility];  Service: General;  Laterality: N/A;    There were no vitals filed for this visit.   Subjective Assessment - 09/12/21 0850     Subjective Pt reports that she was able to sit through her grandson's ball game w/o being limited by pain this weekend.    Pertinent History history chronic LBP, HTN, arthritis, obesity    Diagnostic tests 10/3- XR An AP pelvis and lateral right hip shows normal-appearing hip bilaterally.    Patient Stated Goals decrease pain and have exercises to help self    Currently in Pain? No/denies                [facility] PT Assessment - 09/12/21 0001       Observation/Other Assessments   Focus on Therapeutic Outcomes (FOTO)  Lumbar: 87%, predicted by visit 10: 66%                           [facility] Adult PT Treatment/Exercise - 09/12/21 0001       Lumbar Exercises: Stretches   Other Lumbar Stretch Exercise fwd flexion stretch with orange Pball 2x15" CW/CCW weight  shifts with hands supported on Pball 10x each      Lumbar Exercises: Aerobic   Nustep L5 x 6 min      Lumbar Exercises: Machines for Strengthening   Other Lumbar Machine Exercise cybex row high grip 2x10, 20lb      Lumbar Exercises: Standing   Shoulder Extension Strengthening;Theraband;15 reps    Theraband Level (Shoulder Extension) Level 3 (Green)    Shoulder Extension Limitations isometric with marches    Other Standing Lumbar Exercises pallof presses with green TB 2x15 reps                     PT Education - 09/12/21 1005     Education Details HEP progressed; Access Code: [email or web address]    Person(s) Educated Patient    Methods Explanation;Demonstration;Tactile cues;Verbal cues;Handout    Comprehension Verbalized understanding;Returned demonstration;Verbal cues required;Tactile cues required              PT Short Term Goals - 08/15/21 0856       PT SHORT TERM GOAL #1   Title Patient will be independent with initial HEP.    Time 2    Period Weeks  Status Achieved   08/15/21- met.   Target Date 08/25/21               PT Long Term Goals - 09/12/21 0900       PT LONG TERM GOAL #1   Title Patient will be independent with progressed HEP to improve outcomes.    Time 6    Period Weeks    Status On-going   11/18- met for current, continue to progress.     PT LONG TERM GOAL #2   Title Patient will report centralization of radicular symptoms in R leg.    Baseline pain radiates down R leg to foot.    Time 6    Period Weeks    Status Achieved   09/12/21- pt reports no radicular symptoms in ~2-3 weeks     PT LONG TERM GOAL #3   Title Patient will report 75% improvement in severity/incidence of R sciatic pain to improve QOL.    Time 6    Period Weeks    Status Achieved   09/12/21     PT LONG TERM GOAL #4   Title Patient will be able to complete all desired activities without limitation from R sciatic pain.    Time 6    Period Weeks    Status On-going    11/29- pt reports ability to walk around going shopping with no pain                  Plan - 09/12/21 0934     Clinical Impression Statement Pt subjectively has had resolution of radicular symptoms. Her FOTO score was 87% today, which exceeded the predicted D/C score. Progressed HEP with standing core exercises, provided instructions to squeeze shoulder blades and for controlled movements during exercises. Cues needed to shift weight using her body rather than arms with fwd flexion weight shifts. Pt will be ready for transition to HEP next visit.    Personal Factors and Comorbidities Comorbidity 2    Comorbidities arthritis, hypertension, obesity    PT Frequency 2x / week    PT Duration 6 weeks    PT Treatment/Interventions ADLs/Self Care Home Management;Cryotherapy;Electrical Stimulation;Moist Heat;Traction;Ultrasound;Gait training;Stair training;Functional mobility training;Therapeutic activities;Therapeutic exercise;Balance training;Neuromuscular re-education;Passive range of motion;Dry needling;Manual techniques;Taping;Spinal Manipulations;Joint Manipulations    PT Next Visit Plan D/C or 30 day hold    PT Home Exercise Plan [id number].  [id number]    Consulted and Agree with Plan of Care Patient             Patient will benefit from skilled therapeutic intervention in order to improve the following deficits and impairments:  Decreased activity tolerance, Decreased range of motion, Decreased strength, Increased fascial restricitons, Pain, Decreased mobility, Increased muscle spasms, Impaired flexibility  Visit Diagnosis: Chronic right-sided low back pain with right-sided sciatica  Other muscle spasm  Muscle weakness (generalized)     Problem List Patient Active Problem List   Diagnosis Date Noted   Acute calculous cholecystitis 02/12/2017    Artist Pais, PTA 09/12/2021, 10:06 AM  [facility] [address]   [place], [place], [address] Phone: [phone]   Fax:  [phone]  Name: Jennifer Reese MRN: [id number] Date of Birth: 1956/02/04

## 2021-09-15 ENCOUNTER — Encounter: Payer: Medicare Other | Admitting: Physical Therapy

## 2021-09-18 ENCOUNTER — Encounter: Payer: Self-pay | Admitting: Orthopaedic Surgery

## 2021-09-18 ENCOUNTER — Ambulatory Visit: Payer: Medicare Other | Admitting: Orthopaedic Surgery

## 2021-09-18 ENCOUNTER — Other Ambulatory Visit: Payer: Self-pay

## 2021-09-18 DIAGNOSIS — M5441 Lumbago with sciatica, right side: Secondary | ICD-10-CM | POA: Diagnosis not present

## 2021-09-18 NOTE — Progress Notes (Signed)
The patient comes in today after having successful physical therapy to treat her sciatica.  She is doing much better overall with a course of therapy.  We had had her on anti-inflammatories and a steroid taper as well as a muscle relaxant.  She is almost asymptomatic she states.  She said the exercises are helpful and she is transition to home exercise program as well.  She denies any weakness or numbness and tingling in her bilateral lower extremities.  She denies any sciatic symptoms.  She has excellent strength in both her lower extremities with normal sensation.  She has negative straight leg raise bilaterally.  This point follow-up can be as needed since she is doing well.  All questions and concerns were answered and addressed.

## 2021-09-19 ENCOUNTER — Encounter: Payer: Self-pay | Admitting: Physical Therapy

## 2021-09-19 ENCOUNTER — Ambulatory Visit: Payer: Medicare Other | Admitting: Physical Therapy

## 2021-09-19 DIAGNOSIS — M62838 Other muscle spasm: Secondary | ICD-10-CM

## 2021-09-19 DIAGNOSIS — M6281 Muscle weakness (generalized): Secondary | ICD-10-CM

## 2021-09-19 DIAGNOSIS — M5441 Lumbago with sciatica, right side: Secondary | ICD-10-CM | POA: Diagnosis not present

## 2021-09-19 DIAGNOSIS — G8929 Other chronic pain: Secondary | ICD-10-CM

## 2021-09-19 NOTE — Patient Instructions (Signed)
Access Code: [email or web address] URL: [email or web address] Date: 09/19/2021 Prepared by: Harrie Foreman  Exercises Supine Lumbar Rotation with Swiss Ball - 1 x daily - 7 x weekly - 3 sets - 10 reps Supine Knees to Chest with Swiss Ball - 1 x daily - 7 x weekly - 3 sets - 10 reps Bridge with Hamstring Curl on Swiss Ball - 1 x daily - 7 x weekly - 3 sets - 10 reps Abdominal Press into [place] and Roll - 1 x daily - 7 x weekly - 3 sets - 10 reps Slow Controlled Bounce on Swiss Ball - 1 x daily - 7 x weekly - 3 sets - 10 reps Pelvic Circles on [employer] - 1 x daily - 7 x weekly - 3 sets - 10 reps Swiss Ball March - 1 x daily - 7 x weekly - 3 sets - 10 reps Swiss Ball Knee Extension - 1 x daily - 7 x weekly - 3 sets - 10 reps Seated 3 Way Exercise [employer] Stretch - 1 x daily - 7 x weekly - 3 sets - 10 reps Row with Anchored Resistance on Swiss Ball - 1 x daily - 7 x weekly - 3 sets - 10 reps Wall Squat with Swiss Ball - 1 x daily - 7 x weekly - 3 sets - 10 reps Prone Hip Extension on Swiss Ball - 1 x daily - 7 x weekly - 3 sets - 10 reps

## 2021-09-19 NOTE — Therapy (Signed)
[facility] [address]  [place], [place], [address] Phone: [phone]   Fax:  [phone]  Physical Therapy Treatment PHYSICAL THERAPY DISCHARGE SUMMARY  Visits from Start of Care: 7  Current functional level related to goals / functional outcomes: Patient has made very good progress with PT, met all her goals and reports resolution of back pain.  FOTO improved to 87%.   Remaining deficits: none   Education / Equipment: HEP, red and greed therabands  Plan: Patient agrees to discharge.   Patient is being discharged due to meeting the stated rehab goals.       Patient Details  Name: Jennifer Reese MRN: [id number] Date of Birth: 1956/05/20 Referring Provider (PT): Jean Rosenthal MD   Encounter Date: 09/19/2021   PT End of Session - 09/19/21 0808     Visit Number 7    Number of Visits 12    Date for PT Re-Evaluation 09/22/21    Authorization Type [employer]    Progress Note Due on Visit 10    PT Start Time 0802    PT Stop Time 0842    PT Time Calculation (min) 40 min    Activity Tolerance Patient tolerated treatment well    Behavior During Therapy [facility] for tasks assessed/performed             Past Medical History:  Diagnosis Date   Hypertension     Past Surgical History:  Procedure Laterality Date   CHOLECYSTECTOMY N/A 02/12/2017   Procedure: LAPAROSCOPIC CHOLECYSTECTOMY;  Surgeon: Mickeal Skinner, MD;  Location: [facility];  Service: General;  Laterality: N/A;    There were no vitals filed for this visit.   Subjective Assessment - 09/19/21 0806     Subjective Patient reports she has done very well, released from her doctor.    Pertinent History history chronic LBP, HTN, arthritis, obesity    Diagnostic tests 10/3- XR An AP pelvis and lateral right hip shows normal-appearing hip bilaterally.    Patient Stated Goals decrease pain and have exercises to help self    Currently in  Pain? No/denies                               [facility] Adult PT Treatment/Exercise - 09/19/21 0001       Lumbar Exercises: Aerobic   Nustep L6 x 6 min                     PT Education - 09/19/21 0844     Education Details Progressed HEP to include swiss ball exercises Access Code: [email or web address]    Person(s) Educated Patient    Methods Explanation;Demonstration;Verbal cues;Handout    Comprehension Verbalized understanding;Returned demonstration              PT Short Term Goals - 08/15/21 0856       PT SHORT TERM GOAL #1   Title Patient will be independent with initial HEP.    Time 2    Period Weeks    Status Achieved   08/15/21- met.   Target Date 08/25/21               PT Long Term Goals - 09/19/21 0809       PT LONG TERM GOAL #1   Title Patient will be independent with progressed HEP to improve outcomes.    Time 6    Period  Weeks    Status Achieved   11/18- met for current, continue to progress. 12/13/- met     PT LONG TERM GOAL #2   Title Patient will report centralization of radicular symptoms in R leg.    Baseline pain radiates down R leg to foot.    Time 6    Period Weeks    Status Achieved   09/12/21- pt reports no radicular symptoms in ~2-3 weeks     PT LONG TERM GOAL #3   Title Patient will report 75% improvement in severity/incidence of R sciatic pain to improve QOL.    Time 6    Period Weeks    Status Achieved   09/12/21     PT LONG TERM GOAL #4   Title Patient will be able to complete all desired activities without limitation from R sciatic pain.    Time 6    Period Weeks    Status Achieved   11/29- pt reports ability to walk around going shopping with no pain  09/19/21- no limitations due to LBP.                  Plan - 09/19/21 0808     Clinical Impression Statement Patient has made very good progress with PT, met all her goals and reports resolution of back pain.  FOTO improved to 87%.  Today focused  on progressing HEP to include swiss ball exercises for core strengthening, since she has a swiss ball at home and wants exercises that she can do at home when weather is bad.  She is considering gym membership for new year.  She is ready and appropriate for discharge.    Personal Factors and Comorbidities Comorbidity 2    Comorbidities arthritis, hypertension, obesity    PT Frequency 2x / week    PT Duration 6 weeks    PT Treatment/Interventions ADLs/Self Care Home Management;Cryotherapy;Electrical Stimulation;Moist Heat;Traction;Ultrasound;Gait training;Stair training;Functional mobility training;Therapeutic activities;Therapeutic exercise;Balance training;Neuromuscular re-education;Passive range of motion;Dry needling;Manual techniques;Taping;Spinal Manipulations;Joint Manipulations    PT Next Visit Plan D/C or 30 day hold    PT Home Exercise Plan [id number].  [id number]    Consulted and Agree with Plan of Care Patient             Patient will benefit from skilled therapeutic intervention in order to improve the following deficits and impairments:  Decreased activity tolerance, Decreased range of motion, Decreased strength, Increased fascial restricitons, Pain, Decreased mobility, Increased muscle spasms, Impaired flexibility  Visit Diagnosis: Chronic right-sided low back pain with right-sided sciatica  Other muscle spasm  Muscle weakness (generalized)     Problem List Patient Active Problem List   Diagnosis Date Noted   Acute calculous cholecystitis 02/12/2017    Jennifer Reese, PT, DPT  09/19/2021, 8:49 AM  [facility] [address]  [place], [place], [address] Phone: [phone]   Fax:  [phone]  Name: Jennifer Reese MRN: [id number] Date of Birth: [birth year]

## 2021-09-21 ENCOUNTER — Other Ambulatory Visit: Payer: Self-pay | Admitting: Orthopaedic Surgery

## 2021-09-27 ENCOUNTER — Other Ambulatory Visit: Payer: Self-pay | Admitting: Family Medicine

## 2021-09-27 DIAGNOSIS — Z1231 Encounter for screening mammogram for malignant neoplasm of breast: Secondary | ICD-10-CM

## 2021-09-28 ENCOUNTER — Other Ambulatory Visit: Payer: Self-pay | Admitting: Family Medicine

## 2021-09-28 DIAGNOSIS — M8580 Other specified disorders of bone density and structure, unspecified site: Secondary | ICD-10-CM

## 2021-10-18 ENCOUNTER — Ambulatory Visit (INDEPENDENT_AMBULATORY_CARE_PROVIDER_SITE_OTHER): Payer: Medicare Other

## 2021-10-18 ENCOUNTER — Other Ambulatory Visit: Payer: Self-pay

## 2021-10-18 ENCOUNTER — Ambulatory Visit: Payer: Medicare Other

## 2021-10-18 DIAGNOSIS — Z1231 Encounter for screening mammogram for malignant neoplasm of breast: Secondary | ICD-10-CM

## 2021-10-25 ENCOUNTER — Other Ambulatory Visit: Payer: Self-pay | Admitting: Family Medicine

## 2021-10-25 ENCOUNTER — Other Ambulatory Visit: Payer: Medicare Other

## 2021-10-25 DIAGNOSIS — R928 Other abnormal and inconclusive findings on diagnostic imaging of breast: Secondary | ICD-10-CM

## 2021-11-01 ENCOUNTER — Other Ambulatory Visit: Payer: Self-pay

## 2021-11-01 ENCOUNTER — Ambulatory Visit (INDEPENDENT_AMBULATORY_CARE_PROVIDER_SITE_OTHER): Payer: Medicare Other

## 2021-11-01 DIAGNOSIS — M8580 Other specified disorders of bone density and structure, unspecified site: Secondary | ICD-10-CM

## 2021-11-07 ENCOUNTER — Ambulatory Visit
Admission: RE | Admit: 2021-11-07 | Discharge: 2021-11-07 | Disposition: A | Discharge status: Home / Self Care | Payer: Medicare Other | Source: Ambulatory Visit | Attending: Family Medicine | Admitting: Family Medicine

## 2021-11-07 DIAGNOSIS — R928 Other abnormal and inconclusive findings on diagnostic imaging of breast: Secondary | ICD-10-CM

## 2022-10-12 ENCOUNTER — Other Ambulatory Visit: Payer: Self-pay | Admitting: Family Medicine

## 2022-10-12 DIAGNOSIS — Z1231 Encounter for screening mammogram for malignant neoplasm of breast: Secondary | ICD-10-CM

## 2022-12-03 ENCOUNTER — Ambulatory Visit
Admission: RE | Admit: 2022-12-03 | Discharge: 2022-12-03 | Disposition: A | Discharge status: Home / Self Care | Payer: Medicare Other | Source: Ambulatory Visit | Attending: Family Medicine | Admitting: Family Medicine

## 2022-12-03 DIAGNOSIS — Z1231 Encounter for screening mammogram for malignant neoplasm of breast: Secondary | ICD-10-CM

## 2023-06-12 IMAGING — MG DIGITAL DIAGNOSTIC UNILAT LEFT W/ CAD
3 series · 3 of 3 positions shown · non-contrast
Comparison: Previous exam(s).

CLINICAL DATA: Screening recall for left breast calcifications.

EXAM:
DIGITAL DIAGNOSTIC UNILATERAL LEFT MAMMOGRAM WITH CAD
TECHNIQUE: Left digital diagnostic mammography was performed. Mammographic
images were processed with CAD.

[L ML (1 of 2)]
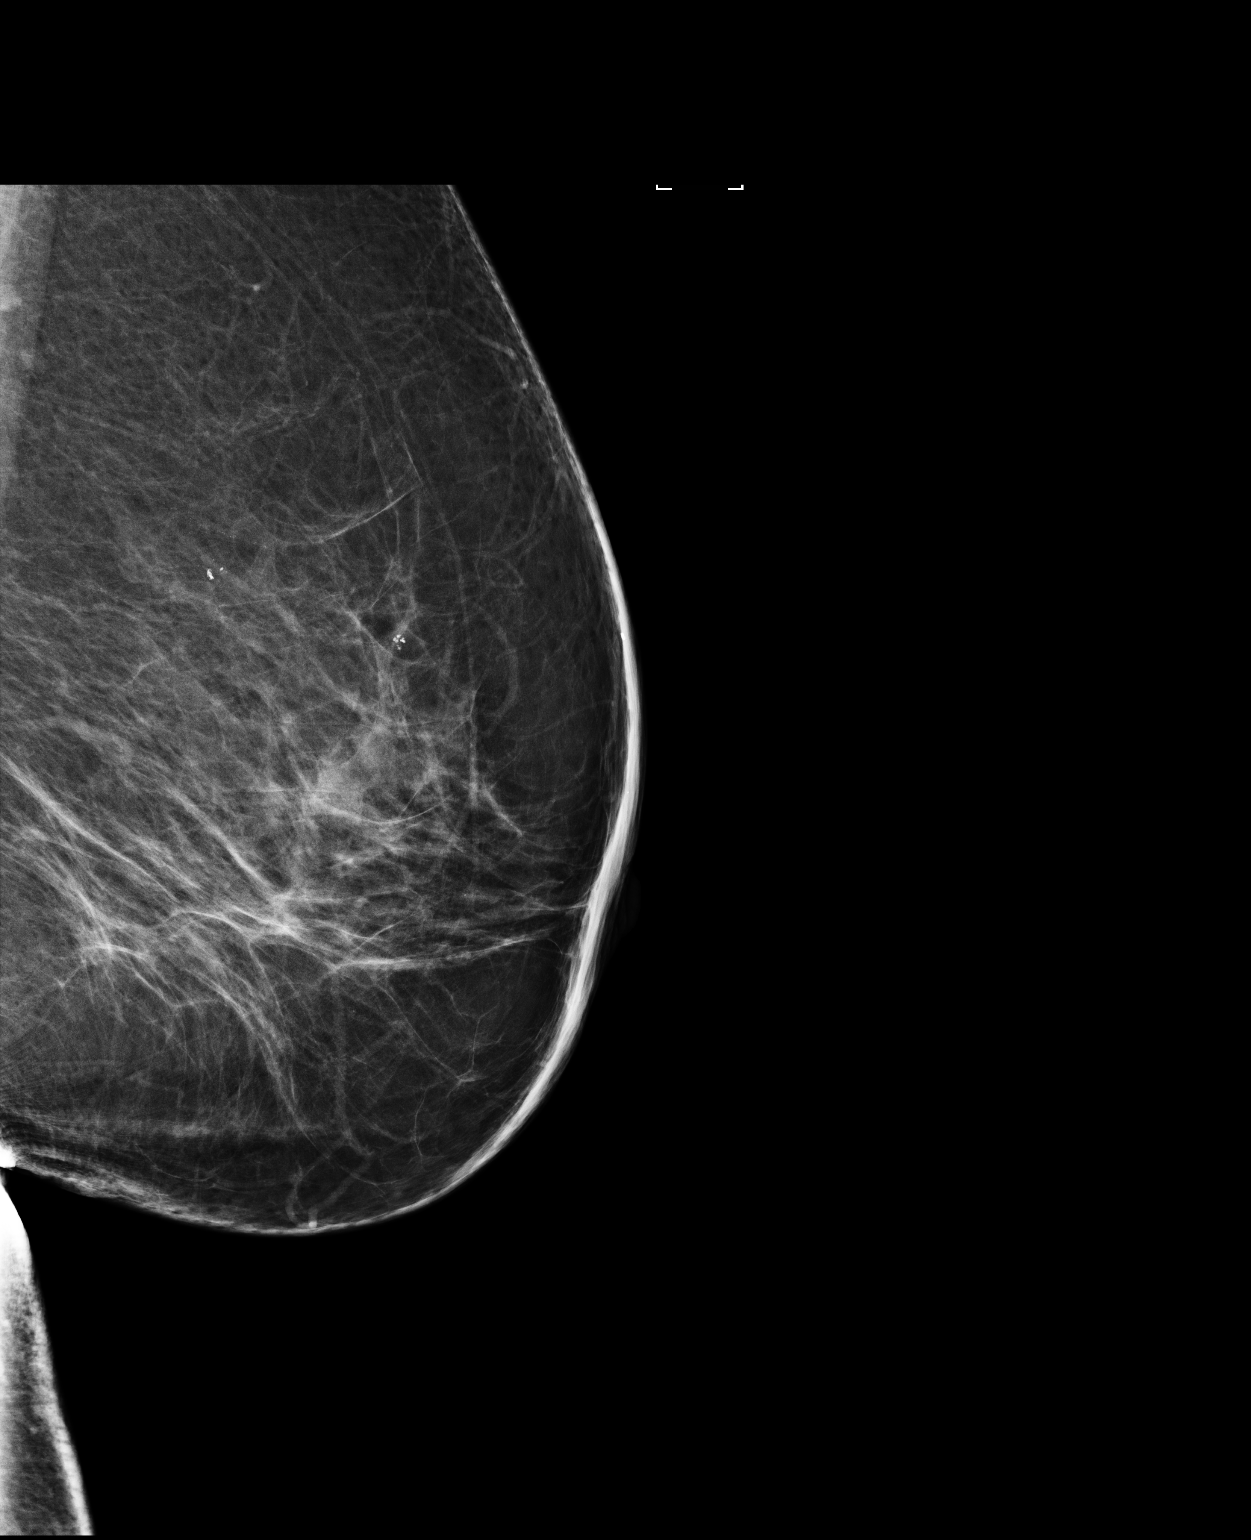

[L CC]
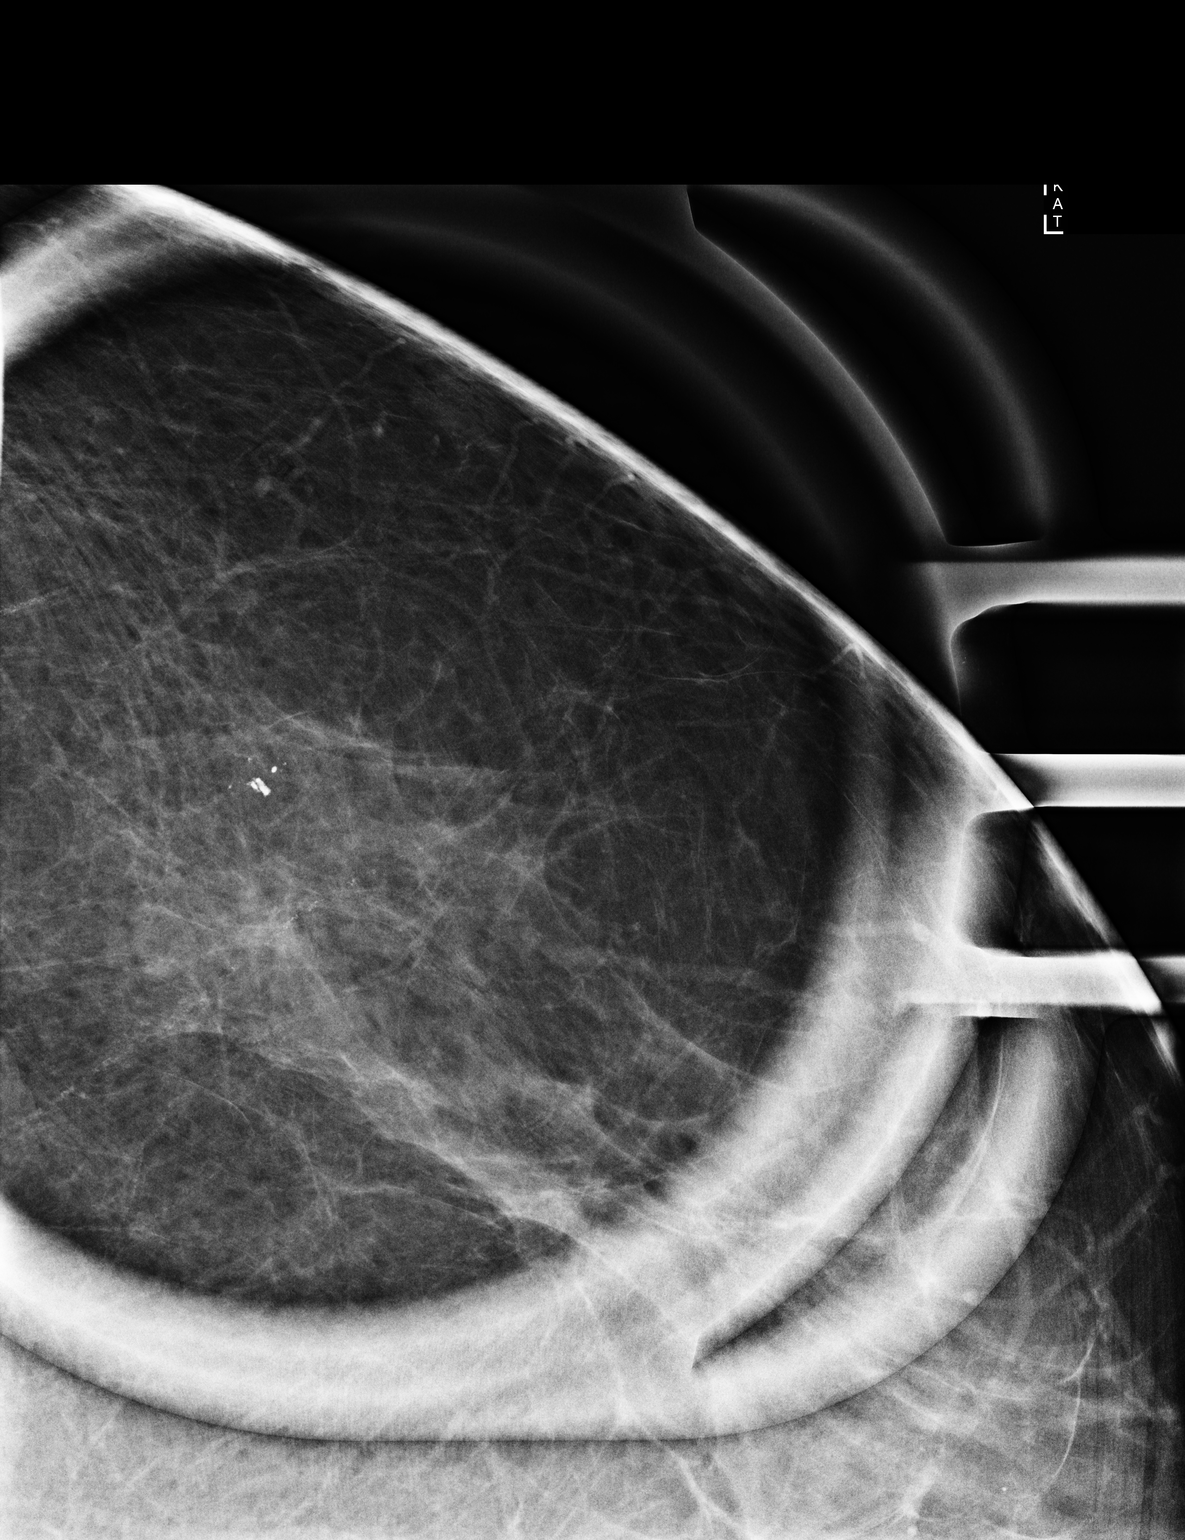

[L ML (2 of 2)]
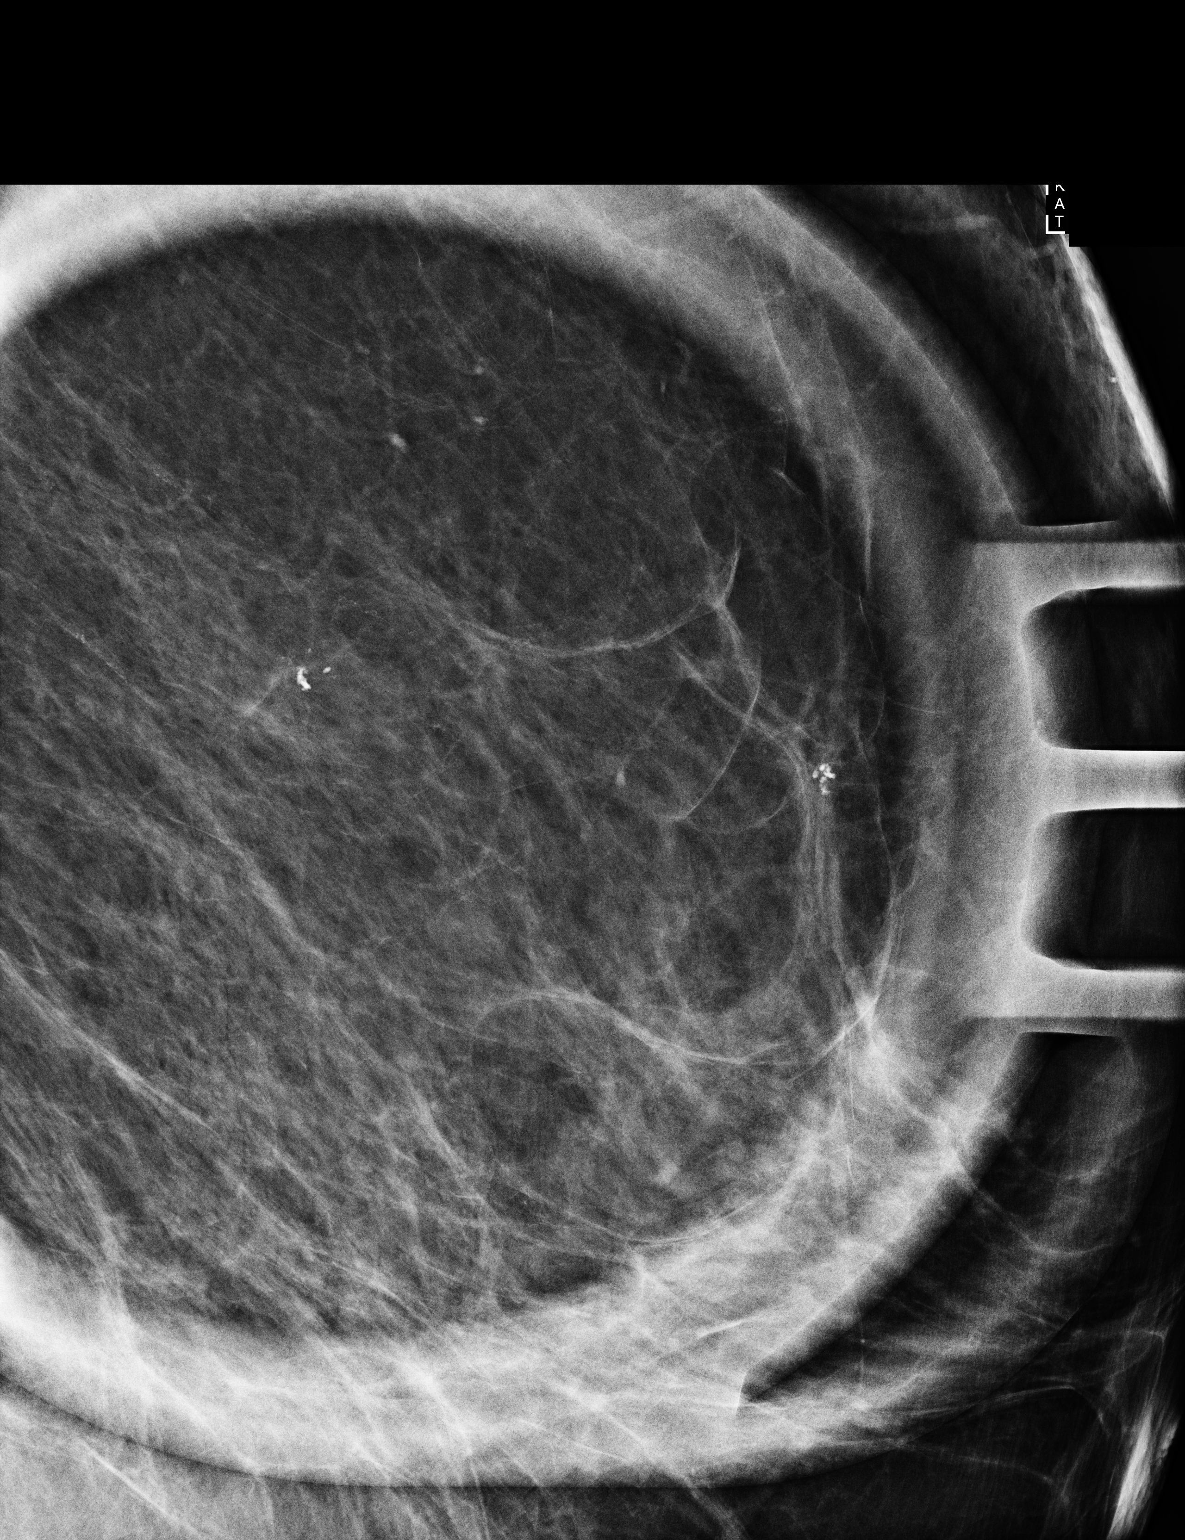

[3 of 3 positions shown; findings below may reference images not displayed]

ACR Breast Density Category b: There are scattered areas of
fibroglandular density.
FINDINGS: Spot compression magnification images over the 3 mm group of coarse
heterogeneous calcifications demonstrates that they appear stable
over the patient's prior few mammograms. She does have another
stable 2-3 mm group of calcifications with a similar appearance
farther anterior within the left breast.
IMPRESSION: The calcifications in the left breast have been stable for at least
2 years, and are therefore benign.

RECOMMENDATION:
Screening mammogram in one year.(Code:[id number])

I have discussed the findings and recommendations with the patient.
If applicable, a reminder letter will be sent to the patient
regarding the next appointment.

BI-RADS CATEGORY  2: Benign.

## 2023-11-12 ENCOUNTER — Other Ambulatory Visit: Payer: Self-pay | Admitting: Family Medicine

## 2023-11-12 DIAGNOSIS — Z1231 Encounter for screening mammogram for malignant neoplasm of breast: Secondary | ICD-10-CM

## 2023-12-11 ENCOUNTER — Ambulatory Visit
Admission: RE | Admit: 2023-12-11 | Discharge: 2023-12-11 | Disposition: A | Discharge status: Home / Self Care | Payer: Medicare Other | Source: Ambulatory Visit | Attending: Family Medicine | Admitting: Family Medicine

## 2023-12-11 DIAGNOSIS — Z1231 Encounter for screening mammogram for malignant neoplasm of breast: Secondary | ICD-10-CM
